# Patient Record
Sex: Female | Born: 1965 | Race: White | Hispanic: No | Marital: Married | Smoking: Former smoker
Health system: Southern US, Community
[De-identification: ages and names within clinical notes are randomized; demographics above are authoritative.]

## PROBLEM LIST (undated history)

## (undated) DIAGNOSIS — F32A Depression, unspecified: Secondary | ICD-10-CM

## (undated) DIAGNOSIS — D352 Benign neoplasm of pituitary gland: Secondary | ICD-10-CM

## (undated) DIAGNOSIS — F329 Major depressive disorder, single episode, unspecified: Secondary | ICD-10-CM

## (undated) DIAGNOSIS — E785 Hyperlipidemia, unspecified: Secondary | ICD-10-CM

## (undated) HISTORY — PX: ENDOMETRIAL ABLATION W/ NOVASURE: SUR434

## (undated) HISTORY — DX: Hyperlipidemia, unspecified: E78.5

## (undated) HISTORY — DX: Benign neoplasm of pituitary gland: D35.2

---

## 1999-09-15 ENCOUNTER — Other Ambulatory Visit: Admission: RE | Admit: 1999-09-15 | Discharge: 1999-09-15 | Payer: Self-pay | Admitting: General Practice

## 2000-07-18 ENCOUNTER — Ambulatory Visit (HOSPITAL_COMMUNITY): Admission: RE | Admit: 2000-07-18 | Discharge: 2000-07-18 | Payer: Self-pay | Admitting: Internal Medicine

## 2000-07-18 ENCOUNTER — Encounter: Payer: Self-pay | Admitting: Internal Medicine

## 2001-07-02 ENCOUNTER — Other Ambulatory Visit: Admission: RE | Admit: 2001-07-02 | Discharge: 2001-07-02 | Payer: Self-pay | Admitting: Obstetrics and Gynecology

## 2002-08-30 ENCOUNTER — Other Ambulatory Visit: Admission: RE | Admit: 2002-08-30 | Discharge: 2002-08-30 | Payer: Self-pay | Admitting: Obstetrics and Gynecology

## 2002-09-03 ENCOUNTER — Encounter: Admission: RE | Admit: 2002-09-03 | Discharge: 2002-09-03 | Payer: Self-pay | Admitting: Obstetrics and Gynecology

## 2002-09-03 ENCOUNTER — Encounter: Payer: Self-pay | Admitting: Obstetrics and Gynecology

## 2002-11-18 ENCOUNTER — Encounter: Payer: Self-pay | Admitting: Obstetrics and Gynecology

## 2002-11-18 ENCOUNTER — Encounter: Admission: RE | Admit: 2002-11-18 | Discharge: 2002-11-18 | Payer: Self-pay | Admitting: Obstetrics and Gynecology

## 2005-07-30 ENCOUNTER — Encounter: Admission: RE | Admit: 2005-07-30 | Discharge: 2005-07-30 | Payer: Self-pay | Admitting: Obstetrics & Gynecology

## 2005-10-08 ENCOUNTER — Encounter: Admission: RE | Admit: 2005-10-08 | Discharge: 2005-10-08 | Payer: Self-pay | Admitting: Adult Health

## 2005-12-30 ENCOUNTER — Emergency Department (HOSPITAL_COMMUNITY): Admission: EM | Admit: 2005-12-30 | Discharge: 2005-12-30 | Payer: Self-pay | Admitting: Emergency Medicine

## 2006-05-03 ENCOUNTER — Encounter: Admission: RE | Admit: 2006-05-03 | Discharge: 2006-05-03 | Payer: Self-pay | Admitting: Obstetrics & Gynecology

## 2007-10-14 ENCOUNTER — Ambulatory Visit (HOSPITAL_COMMUNITY): Admission: RE | Admit: 2007-10-14 | Discharge: 2007-10-14 | Payer: Self-pay | Admitting: Obstetrics & Gynecology

## 2007-12-08 ENCOUNTER — Encounter: Admission: RE | Admit: 2007-12-08 | Discharge: 2007-12-08 | Payer: Self-pay | Admitting: Obstetrics & Gynecology

## 2009-01-03 ENCOUNTER — Ambulatory Visit (HOSPITAL_COMMUNITY): Admission: RE | Admit: 2009-01-03 | Discharge: 2009-01-03 | Payer: Self-pay | Admitting: Internal Medicine

## 2009-08-17 ENCOUNTER — Encounter: Admission: RE | Admit: 2009-08-17 | Discharge: 2009-08-17 | Payer: Self-pay | Admitting: Internal Medicine

## 2009-09-05 ENCOUNTER — Emergency Department (HOSPITAL_COMMUNITY): Admission: EM | Admit: 2009-09-05 | Discharge: 2009-09-06 | Payer: Self-pay | Admitting: Emergency Medicine

## 2010-04-17 ENCOUNTER — Encounter: Admission: RE | Admit: 2010-04-17 | Discharge: 2010-04-17 | Payer: Self-pay | Admitting: Internal Medicine

## 2010-11-25 ENCOUNTER — Encounter: Payer: Self-pay | Admitting: Internal Medicine

## 2011-02-02 ENCOUNTER — Emergency Department (HOSPITAL_COMMUNITY): Payer: Managed Care, Other (non HMO)

## 2011-02-02 ENCOUNTER — Emergency Department (HOSPITAL_COMMUNITY)
Admission: EM | Admit: 2011-02-02 | Discharge: 2011-02-02 | Disposition: A | Discharge status: Home / Self Care | Payer: Managed Care, Other (non HMO) | Attending: Emergency Medicine | Admitting: Emergency Medicine

## 2011-02-02 DIAGNOSIS — R071 Chest pain on breathing: Secondary | ICD-10-CM | POA: Insufficient documentation

## 2011-02-02 DIAGNOSIS — R209 Unspecified disturbances of skin sensation: Secondary | ICD-10-CM | POA: Insufficient documentation

## 2011-02-02 DIAGNOSIS — R197 Diarrhea, unspecified: Secondary | ICD-10-CM | POA: Insufficient documentation

## 2011-02-02 DIAGNOSIS — F17200 Nicotine dependence, unspecified, uncomplicated: Secondary | ICD-10-CM | POA: Insufficient documentation

## 2011-02-02 DIAGNOSIS — R072 Precordial pain: Secondary | ICD-10-CM | POA: Insufficient documentation

## 2011-02-02 LAB — CBC
HCT: 38.1 % (ref 36.0–46.0)
MCH: 34.1 pg — ABNORMAL HIGH (ref 26.0–34.0)
MCHC: 34.6 g/dL (ref 30.0–36.0)
MCV: 98.4 fL (ref 78.0–100.0)
RDW: 13 % (ref 11.5–15.5)

## 2011-02-02 LAB — DIFFERENTIAL
Eosinophils Relative: 4 % (ref 0–5)
Lymphocytes Relative: 34 % (ref 12–46)
Lymphs Abs: 2.3 10*3/uL (ref 0.7–4.0)
Monocytes Absolute: 0.5 10*3/uL (ref 0.1–1.0)
Monocytes Relative: 8 % (ref 3–12)

## 2011-02-02 LAB — BASIC METABOLIC PANEL
CO2: 25 mEq/L (ref 19–32)
GFR calc Af Amer: >60 mL/min (ref >60)
Glucose, Bld: 91 mg/dL (ref 70–99)
Potassium: 3.2 mEq/L — ABNORMAL LOW (ref 3.5–5.1)
Sodium: 138 mEq/L (ref 135–145)

## 2011-02-02 LAB — POCT CARDIAC MARKERS
CKMB, poc: <1 ng/mL — ABNORMAL LOW (ref 1.0–8.0)
Myoglobin, poc: 26.6 ng/mL (ref 12–200)
Troponin i, poc: <0.05 ng/mL (ref 0.00–0.09)

## 2011-03-21 ENCOUNTER — Other Ambulatory Visit (HOSPITAL_COMMUNITY): Payer: Self-pay | Admitting: Internal Medicine

## 2011-03-21 DIAGNOSIS — Z1231 Encounter for screening mammogram for malignant neoplasm of breast: Secondary | ICD-10-CM

## 2011-04-23 ENCOUNTER — Ambulatory Visit (HOSPITAL_COMMUNITY)
Admission: RE | Admit: 2011-04-23 | Discharge: 2011-04-23 | Disposition: A | Discharge status: Home / Self Care | Payer: Managed Care, Other (non HMO) | Source: Ambulatory Visit | Attending: Internal Medicine | Admitting: Internal Medicine

## 2011-04-23 DIAGNOSIS — Z1231 Encounter for screening mammogram for malignant neoplasm of breast: Secondary | ICD-10-CM | POA: Insufficient documentation

## 2011-11-07 ENCOUNTER — Encounter (HOSPITAL_COMMUNITY): Payer: Self-pay

## 2011-11-07 ENCOUNTER — Encounter (HOSPITAL_COMMUNITY): Payer: Self-pay | Admitting: *Deleted

## 2011-11-11 ENCOUNTER — Other Ambulatory Visit: Payer: Self-pay | Admitting: Obstetrics & Gynecology

## 2011-11-15 ENCOUNTER — Encounter (HOSPITAL_COMMUNITY): Payer: Self-pay | Admitting: Anesthesiology

## 2011-11-15 ENCOUNTER — Ambulatory Visit (HOSPITAL_COMMUNITY): Payer: Managed Care, Other (non HMO) | Admitting: Anesthesiology

## 2011-11-15 ENCOUNTER — Other Ambulatory Visit: Payer: Self-pay | Admitting: Obstetrics & Gynecology

## 2011-11-15 ENCOUNTER — Encounter (HOSPITAL_COMMUNITY)
Admission: RE | Disposition: A | Discharge status: Home / Self Care | Payer: Self-pay | Source: Ambulatory Visit | Attending: Obstetrics & Gynecology

## 2011-11-15 ENCOUNTER — Ambulatory Visit (HOSPITAL_COMMUNITY)
Admission: RE | Admit: 2011-11-15 | Discharge: 2011-11-15 | Disposition: A | Discharge status: Home / Self Care | Payer: Managed Care, Other (non HMO) | Source: Ambulatory Visit | Attending: Obstetrics & Gynecology | Admitting: Obstetrics & Gynecology

## 2011-11-15 ENCOUNTER — Encounter (HOSPITAL_COMMUNITY): Payer: Self-pay | Admitting: *Deleted

## 2011-11-15 DIAGNOSIS — D250 Submucous leiomyoma of uterus: Secondary | ICD-10-CM | POA: Insufficient documentation

## 2011-11-15 DIAGNOSIS — N920 Excessive and frequent menstruation with regular cycle: Secondary | ICD-10-CM | POA: Insufficient documentation

## 2011-11-15 HISTORY — DX: Depression, unspecified: F32.A

## 2011-11-15 HISTORY — DX: Major depressive disorder, single episode, unspecified: F32.9

## 2011-11-15 LAB — CBC
Hemoglobin: 10.2 g/dL — ABNORMAL LOW (ref 12.0–15.0)
MCHC: 33.3 g/dL (ref 30.0–36.0)
Platelets: 280 10*3/uL (ref 150–400)
RBC: 3.04 MIL/uL — ABNORMAL LOW (ref 3.87–5.11)

## 2011-11-15 LAB — PREGNANCY, URINE: Preg Test, Ur: NEGATIVE

## 2011-11-15 SURGERY — DILATATION & CURETTAGE/HYSTEROSCOPY WITH RESECTOCOPE
Anesthesia: General | Site: Vagina | Laterality: Right | Wound class: Clean Contaminated

## 2011-11-15 MED ORDER — FENTANYL CITRATE 0.05 MG/ML IJ SOLN: 25.0000 ug | INTRAMUSCULAR | Status: DC | PRN

## 2011-11-15 MED ORDER — FENTANYL CITRATE 0.05 MG/ML IJ SOLN
INTRAMUSCULAR | Status: AC
  Filled 2011-11-15: qty 2

## 2011-11-15 MED ORDER — DEXAMETHASONE SODIUM PHOSPHATE 10 MG/ML IJ SOLN
INTRAMUSCULAR | Status: AC
  Filled 2011-11-15: qty 1

## 2011-11-15 MED ORDER — MEPERIDINE HCL 25 MG/ML IJ SOLN: 6.2500 mg | INTRAMUSCULAR | Status: DC | PRN

## 2011-11-15 MED ORDER — SODIUM CHLORIDE 0.9 % IR SOLN
Status: DC | PRN
  Administered 2011-11-15 (×3): 3000 mL

## 2011-11-15 MED ORDER — EPHEDRINE SULFATE 50 MG/ML IJ SOLN
INTRAMUSCULAR | Status: DC | PRN
  Administered 2011-11-15: 10 mg via INTRAVENOUS

## 2011-11-15 MED ORDER — GLYCOPYRROLATE 0.2 MG/ML IJ SOLN
INTRAMUSCULAR | Status: DC | PRN
  Administered 2011-11-15: .4 mg via INTRAVENOUS

## 2011-11-15 MED ORDER — LACTATED RINGERS IV SOLN
INTRAVENOUS | Status: DC
  Administered 2011-11-15: 125 mL/h via INTRAVENOUS

## 2011-11-15 MED ORDER — OXYCODONE-ACETAMINOPHEN 7.5-325 MG PO TABS: 1.0000 | ORAL_TABLET | ORAL | Status: AC | PRN

## 2011-11-15 MED ORDER — PROPOFOL 10 MG/ML IV EMUL
INTRAVENOUS | Status: DC | PRN
  Administered 2011-11-15: 200 mg via INTRAVENOUS
  Administered 2011-11-15: 50 mg via INTRAVENOUS

## 2011-11-15 MED ORDER — ATROPINE SULFATE 0.1 MG/ML IJ SOLN
INTRAMUSCULAR | Status: AC
  Filled 2011-11-15: qty 10

## 2011-11-15 MED ORDER — KETOROLAC TROMETHAMINE 30 MG/ML IJ SOLN
INTRAMUSCULAR | Status: AC
  Filled 2011-11-15: qty 1

## 2011-11-15 MED ORDER — KETOROLAC TROMETHAMINE 30 MG/ML IJ SOLN: 15.0000 mg | Freq: Once | INTRAMUSCULAR | Status: DC | PRN

## 2011-11-15 MED ORDER — LIDOCAINE HCL (CARDIAC) 20 MG/ML IV SOLN
INTRAVENOUS | Status: DC | PRN
  Administered 2011-11-15: 60 mg via INTRAVENOUS

## 2011-11-15 MED ORDER — OXYCODONE-ACETAMINOPHEN 5-325 MG PO TABS
ORAL_TABLET | ORAL | Status: AC
  Filled 2011-11-15: qty 1

## 2011-11-15 MED ORDER — FENTANYL CITRATE 0.05 MG/ML IJ SOLN
INTRAMUSCULAR | Status: DC | PRN
  Administered 2011-11-15 (×2): 50 ug via INTRAVENOUS
  Administered 2011-11-15: 25 ug via INTRAVENOUS
  Administered 2011-11-15: 50 ug via INTRAVENOUS
  Administered 2011-11-15 (×2): 12.5 ug via INTRAVENOUS

## 2011-11-15 MED ORDER — ONDANSETRON HCL 4 MG/2ML IJ SOLN: 4.0000 mg | Freq: Once | INTRAMUSCULAR | Status: DC | PRN

## 2011-11-15 MED ORDER — MIDAZOLAM HCL 2 MG/2ML IJ SOLN
INTRAMUSCULAR | Status: AC
  Filled 2011-11-15: qty 2

## 2011-11-15 MED ORDER — ONDANSETRON HCL 4 MG/2ML IJ SOLN
INTRAMUSCULAR | Status: DC | PRN
  Administered 2011-11-15: 4 mg via INTRAVENOUS

## 2011-11-15 MED ORDER — CEFAZOLIN SODIUM 1-5 GM-% IV SOLN
INTRAVENOUS | Status: AC
  Filled 2011-11-15: qty 50

## 2011-11-15 MED ORDER — OXYCODONE-ACETAMINOPHEN 5-325 MG PO TABS
1.0000 | ORAL_TABLET | ORAL | Status: DC | PRN
  Administered 2011-11-15: 1 via ORAL

## 2011-11-15 MED ORDER — CEFAZOLIN SODIUM 1-5 GM-% IV SOLN
1.0000 g | INTRAVENOUS | Status: AC
  Administered 2011-11-15: 1 g via INTRAVENOUS

## 2011-11-15 MED ORDER — GLYCOPYRROLATE 0.2 MG/ML IJ SOLN
INTRAMUSCULAR | Status: AC
  Filled 2011-11-15: qty 1

## 2011-11-15 MED ORDER — KETOROLAC TROMETHAMINE 30 MG/ML IJ SOLN
INTRAMUSCULAR | Status: DC | PRN
  Administered 2011-11-15: 30 mg via INTRAVENOUS

## 2011-11-15 MED ORDER — CHLOROPROCAINE HCL 1 % IJ SOLN
INTRAMUSCULAR | Status: DC | PRN
  Administered 2011-11-15: 20 mL

## 2011-11-15 MED ORDER — ATROPINE SULFATE 0.4 MG/ML IJ SOLN
INTRAMUSCULAR | Status: DC | PRN
  Administered 2011-11-15: 0.4 mg via INTRAVENOUS

## 2011-11-15 MED ORDER — DEXAMETHASONE SODIUM PHOSPHATE 10 MG/ML IJ SOLN
INTRAMUSCULAR | Status: DC | PRN
  Administered 2011-11-15: 10 mg via INTRAVENOUS

## 2011-11-15 MED ORDER — ONDANSETRON HCL 4 MG/2ML IJ SOLN
INTRAMUSCULAR | Status: AC
  Filled 2011-11-15: qty 2

## 2011-11-15 MED ORDER — MIDAZOLAM HCL 5 MG/5ML IJ SOLN
INTRAMUSCULAR | Status: DC | PRN
  Administered 2011-11-15 (×2): 1 mg via INTRAVENOUS

## 2011-11-15 SURGICAL SUPPLY — 14 items
ABLATOR ENDOMETRIAL BIPOLAR (ABLATOR) ×3 IMPLANT
CANISTER SUCTION 2500CC (MISCELLANEOUS) ×6 IMPLANT
CATH ROBINSON RED A/P 16FR (CATHETERS) ×3 IMPLANT
CLOTH BEACON ORANGE TIMEOUT ST (SAFETY) ×3 IMPLANT
CONTAINER PREFILL 10% NBF 60ML (FORM) ×5 IMPLANT
ELECT REM PT RETURN 9FT ADLT (ELECTROSURGICAL) ×3
ELECTRODE REM PT RTRN 9FT ADLT (ELECTROSURGICAL) ×2 IMPLANT
GLOVE BIO SURGEON STRL SZ 6.5 (GLOVE) ×6 IMPLANT
GOWN PREVENTION PLUS LG XLONG (DISPOSABLE) ×6 IMPLANT
GOWN STRL REIN XL XLG (GOWN DISPOSABLE) ×3 IMPLANT
LOOP ANGLED CUTTING 22FR (CUTTING LOOP) IMPLANT
PACK HYSTEROSCOPY LF (CUSTOM PROCEDURE TRAY) ×3 IMPLANT
TOWEL OR 17X24 6PK STRL BLUE (TOWEL DISPOSABLE) ×6 IMPLANT
WATER STERILE IRR 1000ML POUR (IV SOLUTION) ×2 IMPLANT

## 2011-11-15 NOTE — Anesthesia Postprocedure Evaluation (Signed)
Anesthesia Post Note  Patient: Tami Russell  Procedure(s) Performed:  DILATATION & CURETTAGE/HYSTEROSCOPY WITH RESECTOCOPE - with versapoint; DILATATION & CURETTAGE/HYSTEROSCOPY WITH NOVASURE ABLATION  Anesthesia type: General  Patient location: PACU  Post pain: Pain level controlled  Post assessment: Post-op Vital signs reviewed  Last Vitals:  Filed Vitals:   11/15/11 1645  BP: 124/77  Pulse: 83  Temp:   Resp: 18    Post vital signs: Reviewed  Level of consciousness: sedated  Complications: No apparent anesthesia complicationsfj

## 2011-11-15 NOTE — Discharge Summary (Signed)
  Physician Discharge Summary  Patient ID: Tami Russell MRN: [id number] DOB/AGE: 45-Apr-1967 45 y.o.  Admit date: 11/15/2011 Discharge date: 11/15/2011  Admission Diagnoses: Menorrhagia  Discharge Diagnoses: Menorrhagia        Active Problems:  * No active hospital problems. *    Discharged Condition: good  Hospital Course:  Outpatient  Consults: none  Treatments: surgery: Hysteroscopy, Versapoint resection of myoma, D+C, Novasure endometrial ablation.  Disposition: Home or Self Care   Current Discharge Medication List    START taking these medications   Details  oxyCODONE-acetaminophen (PERCOCET) 7.5-325 MG per tablet Take 1 tablet by mouth every 4 (four) hours as needed for pain. Qty: 30 tablet, Refills: 0      CONTINUE these medications which have NOT CHANGED   Details  FLUoxetine (PROZAC) 20 MG capsule Take 20 mg by mouth daily.      calcium citrate-vitamin D 200-200 MG-UNIT TABS Take 2 tablets by mouth daily.      ibuprofen (ADVIL,MOTRIN) 200 MG tablet Take 200 mg by mouth as needed. Pain          Follow-up Information    Follow up with Kassia Demarinis,MARIE-LYNE, MD in 3 weeks.   Contact information:   [address] [place] [address] [phone]          SignedGenia Del, MD 11/15/2011, 4:50 PM

## 2011-11-15 NOTE — H&P (Signed)
Chandrika D Mongiello is an 46 y.o. female G0  RP:  Menometrorrhagia with submucosal myoma for HSC, D+C, Versapoint resection, Novasure endometrial ablation  HPI:  Persistent menometro, with failure to Mirena IUD.  Removed.  Bleeding most days.    Pertinent Gynecological History: Menses: flow is excessive with use of many pads or tampons on heaviest days Bleeding: intermenstrual bleeding Contraception: condoms, planning vasectomy Blood transfusions: none Sexually transmitted diseases: no past history Previous GYN Procedures: None  Last mammogram: normal  Last pap: normal  OB History: G0   Menstrual History:  No LMP recorded.    Past Medical History  Diagnosis Date  . Depression     takes prozac    History reviewed. No pertinent past surgical history.  History reviewed. No pertinent family history.  Social History:  reports that she quit smoking about 10 months ago. She does not have any smokeless tobacco history on file. She reports that she drinks alcohol. She reports that she does not use illicit drugs.  Allergies:  Allergies  Allergen Reactions  . Sulfa Antibiotics Rash    Facial rash    No prescriptions prior to admission    NAD Temp 98.6   BP 148/88  Pulse 73  RR normal Pelvic US Submucosal myoma, 2.8 cm.  Otherwise wnl.  No results found for this or any previous visit (from the past 24 hour(s)).  No results found.  Assessment/Plan: Menometro with submucosal myoma 2.8 cm for HSC, resection, D+C, Novasure endometrial ablation.  Surgery and risks reviewed.  Trea Carnegie,MARIE-LYNE 11/15/2011, 1:49 PM

## 2011-11-15 NOTE — Transfer of Care (Signed)
Immediate Anesthesia Transfer of Care Note  Patient: Tami Russell  Procedure(s) Performed:  DILATATION & CURETTAGE/HYSTEROSCOPY WITH RESECTOCOPE - with versapoint; DILATATION & CURETTAGE/HYSTEROSCOPY WITH NOVASURE ABLATION  Patient Location: PACU  Anesthesia Type: General  Level of Consciousness: awake, alert  and oriented  Airway & Oxygen Therapy: Patient Spontanous Breathing  Post-op Assessment: Report given to PACU RN and Post -op Vital signs reviewed and stable  Post vital signs: Reviewed and stable Filed Vitals:   11/15/11 1404  BP: 148/88  Pulse: 73  Temp: 37 C  Resp: 16    Complications: No apparent anesthesia complications

## 2011-11-15 NOTE — Op Note (Signed)
11/15/2011  4:25 PM  PATIENT:  Tami Russell  46 y.o. female  PRE-OPERATIVE DIAGNOSIS:  Menometrorrhagia with submucosal myoma  POST-OPERATIVE DIAGNOSIS:  same  PROCEDURE:  Procedure(s): HYSTEROSCOPY WITH RESECTION OF SUBMUCOSAL MYOMA (VERSAPOINT), DILATATION AND CURETTAGE AND NOVASURE ENDOMETRIAL ABLATION  SURGEON:  Surgeon(s): Genia Del, MD  ASSISTANTS: none   ANESTHESIA:   general  PROCEDURE:  Under general anesthesia with endotracheal intubation the patient is in lithotomy position. She is prepped with surgiprep and Betadine.  The patient is draped as usual. A timeout is done. The vaginal exam reveals an anteverted uterus about 9 cm diameter, mobile, no adnexal mass.  The speculum was introduced inside the vagina. The anterior lip of the cervix was grasped with a tenaculum. A paracervical block was done with Nesacaine 1% 20 cc total at 4 and 8:00. The cervical measurement was at 4 cm and the hysterometry at 9 cm, for a uterine cavity length of 5 cm.  Dilatation of the cervix was done with Hegar dilators up to #27 without difficulty.  The operative hysteroscope was introduced and the intrauterine cavity. A large submucosal anterior myoma was visualized. The myoma was completely resected with the VersaPoint loop.  Hemostasis was completed with coag using VersaPoint at the myoma bed. The myoma diameter was estimated a little larger than 3 cm. All pieces were sent to pathology.  An endometrial curettage was done with a sharp curet on all intrauterine surfaces. That specimen was sent to pathology with the myoma. We then proceeded with the NovaSure endometrial ablation. The uterine cavity integrity test was passed.The width of the uterus was measured at 3.9 cm.  The NovaSure endometrial ablation lasted 81 seconds. The instrument was then removed. The hysteroscope was reintroduced and the intrauterine cavity and good cauterization was confirmed. Pictures were taken before myomectomy and  after myomectomy as well as after endometrial ablation. All instruments were then removed. Silver nitrate was used to on complete hemostasis where the tenaculum was on the cervix. The patient was brought to recovery room in good stable status.  ESTIMATED BLOOD LOSS: 75 cc  Fluid deficit:  900 cc (saline)   Intake/Output Summary (Last 24 hours) at 11/15/11 1625 Last data filed at 11/15/11 1621  Gross per 24 hour  Intake   1000 ml  Output     75 ml  Net    925 ml     BLOOD ADMINISTERED:none   LOCAL MEDICATIONS USED:  NESACAINE 1% 20 CC PARACERVICAL BLOCK  SPECIMEN:  Source of Specimen:  SUBMUCOSAL MYOMA AND ENDOMETRIAL CURETTINGS  DISPOSITION OF SPECIMEN:  PATHOLOGY  COUNTS:  YES   PLAN OF CARE: Transfer to PACU   Genia Del MD  11/15/2011 at 16:29

## 2011-11-15 NOTE — Anesthesia Preprocedure Evaluation (Signed)
Anesthesia Evaluation  Patient identified by MRN, date of birth, ID band  Reviewed: Allergy & Precautions, H&P , NPO status , Patient's Chart, lab work & pertinent test results  Airway Mallampati: I TM Distance: >3 FB Neck ROM: full    Dental No notable dental hx. (+) Teeth Intact   Pulmonary neg pulmonary ROS,    Pulmonary exam normal       Cardiovascular neg cardio ROS     Neuro/Psych PSYCHIATRIC DISORDERS Depression Negative Neurological ROS     GI/Hepatic negative GI ROS, Neg liver ROS,   Endo/Other  Negative Endocrine ROS  Renal/GU negative Renal ROS  Genitourinary negative   Musculoskeletal   Abdominal Normal abdominal exam  (+)   Peds negative pediatric ROS (+)  Hematology negative hematology ROS (+)   Anesthesia Other Findings   Reproductive/Obstetrics negative OB ROS                           Anesthesia Physical Anesthesia Plan  ASA: II  Anesthesia Plan: General   Post-op Pain Management:    Induction: Intravenous  Airway Management Planned: LMA  Additional Equipment:   Intra-op Plan:   Post-operative Plan:   Informed Consent: I have reviewed the patients History and Physical, chart, labs and discussed the procedure including the risks, benefits and alternatives for the proposed anesthesia with the patient or authorized representative who has indicated his/her understanding and acceptance.     Plan Discussed with: CRNA  Anesthesia Plan Comments:         Anesthesia Quick Evaluation

## 2011-11-15 NOTE — Anesthesia Procedure Notes (Signed)
Procedure Name: LMA Insertion Date/Time: 11/15/2011 3:20 PM Performed by: Claudene Gatliff MARIE Pre-anesthesia Checklist: Patient identified, Emergency Drugs available, Suction available, Patient being monitored and Timeout performed Patient Re-evaluated:Patient Re-evaluated prior to inductionOxygen Delivery Method: Circle System Utilized Preoxygenation: Pre-oxygenation with 100% oxygen Intubation Type: IV induction LMA: LMA inserted LMA Size: 4.0 Placement Confirmation: positive ETCO2 and breath sounds checked- equal and bilateral Dental Injury: Teeth and Oropharynx as per pre-operative assessment

## 2012-04-27 ENCOUNTER — Other Ambulatory Visit (HOSPITAL_COMMUNITY): Payer: Self-pay | Admitting: Obstetrics & Gynecology

## 2012-04-27 DIAGNOSIS — Z1239 Encounter for other screening for malignant neoplasm of breast: Secondary | ICD-10-CM

## 2012-04-27 DIAGNOSIS — Z1231 Encounter for screening mammogram for malignant neoplasm of breast: Secondary | ICD-10-CM

## 2012-05-20 ENCOUNTER — Ambulatory Visit (HOSPITAL_COMMUNITY)
Admission: RE | Admit: 2012-05-20 | Discharge: 2012-05-20 | Disposition: A | Discharge status: Home / Self Care | Payer: Managed Care, Other (non HMO) | Source: Ambulatory Visit | Attending: Obstetrics & Gynecology | Admitting: Obstetrics & Gynecology

## 2012-05-20 DIAGNOSIS — Z1231 Encounter for screening mammogram for malignant neoplasm of breast: Secondary | ICD-10-CM

## 2013-01-29 ENCOUNTER — Other Ambulatory Visit: Payer: Self-pay | Admitting: Dermatology

## 2013-04-20 ENCOUNTER — Other Ambulatory Visit (HOSPITAL_COMMUNITY): Payer: Self-pay | Admitting: Internal Medicine

## 2013-04-20 DIAGNOSIS — Z1231 Encounter for screening mammogram for malignant neoplasm of breast: Secondary | ICD-10-CM

## 2013-05-25 ENCOUNTER — Ambulatory Visit (HOSPITAL_COMMUNITY)
Admission: RE | Admit: 2013-05-25 | Discharge: 2013-05-25 | Disposition: A | Discharge status: Home / Self Care | Payer: Managed Care, Other (non HMO) | Source: Ambulatory Visit | Attending: Internal Medicine | Admitting: Internal Medicine

## 2013-05-25 DIAGNOSIS — Z1231 Encounter for screening mammogram for malignant neoplasm of breast: Secondary | ICD-10-CM

## 2014-02-07 ENCOUNTER — Other Ambulatory Visit: Payer: Self-pay | Admitting: Internal Medicine

## 2014-02-07 ENCOUNTER — Ambulatory Visit
Admission: RE | Admit: 2014-02-07 | Discharge: 2014-02-07 | Disposition: A | Discharge status: Home / Self Care | Payer: Managed Care, Other (non HMO) | Source: Ambulatory Visit | Attending: Internal Medicine | Admitting: Internal Medicine

## 2014-02-07 DIAGNOSIS — T1490XA Injury, unspecified, initial encounter: Secondary | ICD-10-CM

## 2014-04-19 ENCOUNTER — Other Ambulatory Visit (HOSPITAL_COMMUNITY): Payer: Self-pay | Admitting: Internal Medicine

## 2014-04-19 DIAGNOSIS — Z1231 Encounter for screening mammogram for malignant neoplasm of breast: Secondary | ICD-10-CM

## 2014-08-09 ENCOUNTER — Other Ambulatory Visit (HOSPITAL_COMMUNITY): Payer: Self-pay | Admitting: Internal Medicine

## 2014-08-09 DIAGNOSIS — Z1231 Encounter for screening mammogram for malignant neoplasm of breast: Secondary | ICD-10-CM

## 2014-08-12 ENCOUNTER — Ambulatory Visit (HOSPITAL_COMMUNITY)
Admission: RE | Admit: 2014-08-12 | Discharge: 2014-08-12 | Disposition: A | Discharge status: Home / Self Care | Payer: BC Managed Care – PPO | Source: Ambulatory Visit | Attending: Internal Medicine | Admitting: Internal Medicine

## 2014-08-12 DIAGNOSIS — Z1231 Encounter for screening mammogram for malignant neoplasm of breast: Secondary | ICD-10-CM | POA: Diagnosis not present

## 2016-11-04 HISTORY — PX: KNEE SURGERY: SHX244

## 2017-03-21 ENCOUNTER — Ambulatory Visit
Admission: RE | Admit: 2017-03-21 | Discharge: 2017-03-21 | Disposition: A | Discharge status: Home / Self Care | Payer: 59 | Source: Ambulatory Visit | Attending: Internal Medicine | Admitting: Internal Medicine

## 2017-03-21 ENCOUNTER — Other Ambulatory Visit: Payer: Self-pay | Admitting: Internal Medicine

## 2017-03-21 DIAGNOSIS — T1490XA Injury, unspecified, initial encounter: Secondary | ICD-10-CM

## 2017-05-08 ENCOUNTER — Other Ambulatory Visit: Payer: Self-pay | Admitting: Internal Medicine

## 2017-05-08 DIAGNOSIS — M25562 Pain in left knee: Secondary | ICD-10-CM

## 2017-05-14 ENCOUNTER — Ambulatory Visit
Admission: RE | Admit: 2017-05-14 | Discharge: 2017-05-14 | Disposition: A | Discharge status: Home / Self Care | Payer: 59 | Source: Ambulatory Visit | Attending: Internal Medicine | Admitting: Internal Medicine

## 2017-05-14 DIAGNOSIS — M25562 Pain in left knee: Secondary | ICD-10-CM

## 2017-09-04 IMAGING — CR DG KNEE COMPLETE 4+V*L*
4 series · 4 of 4 positions shown · non-contrast
Comparison: None.

CLINICAL DATA: Pain after hitting patella with tennis racket

EXAM:
LEFT KNEE - COMPLETE 4+ VIEW

[w knee ap left]
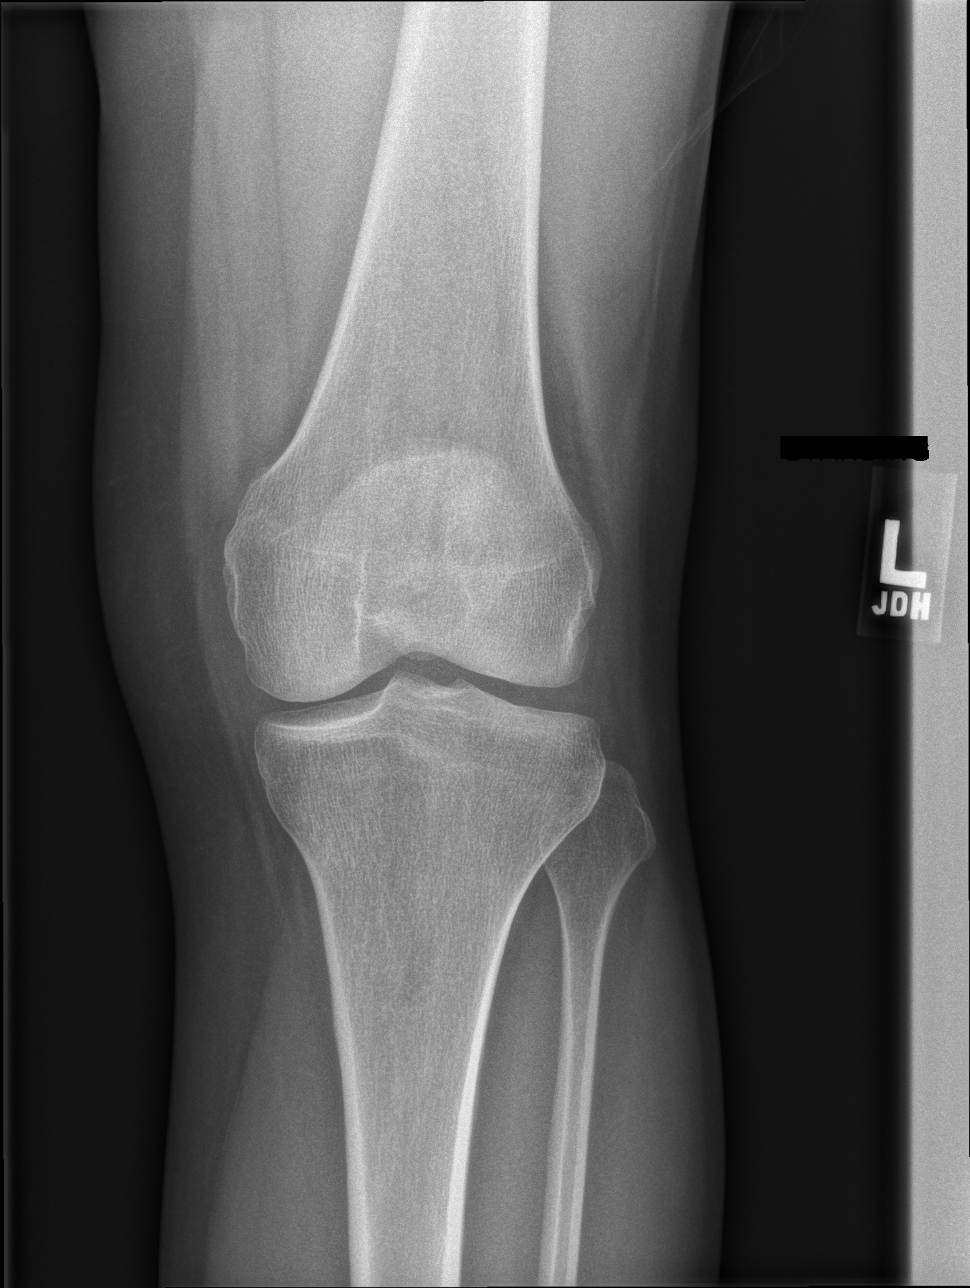

[w knee lat left]
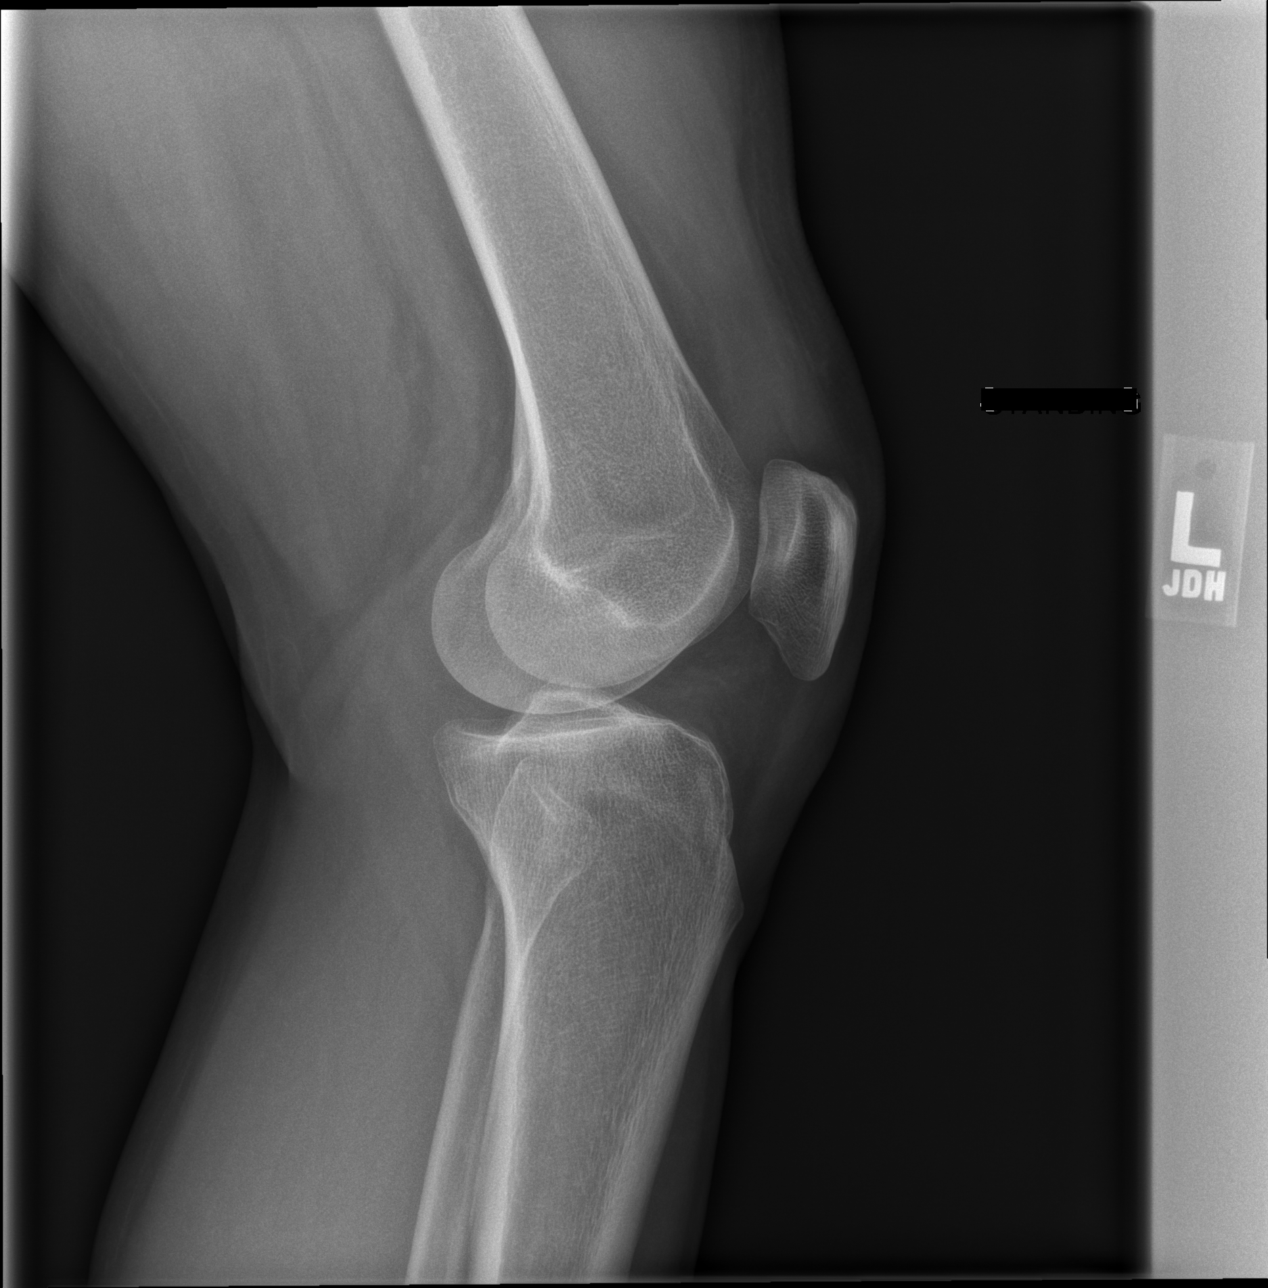

[x knee tunnel left]
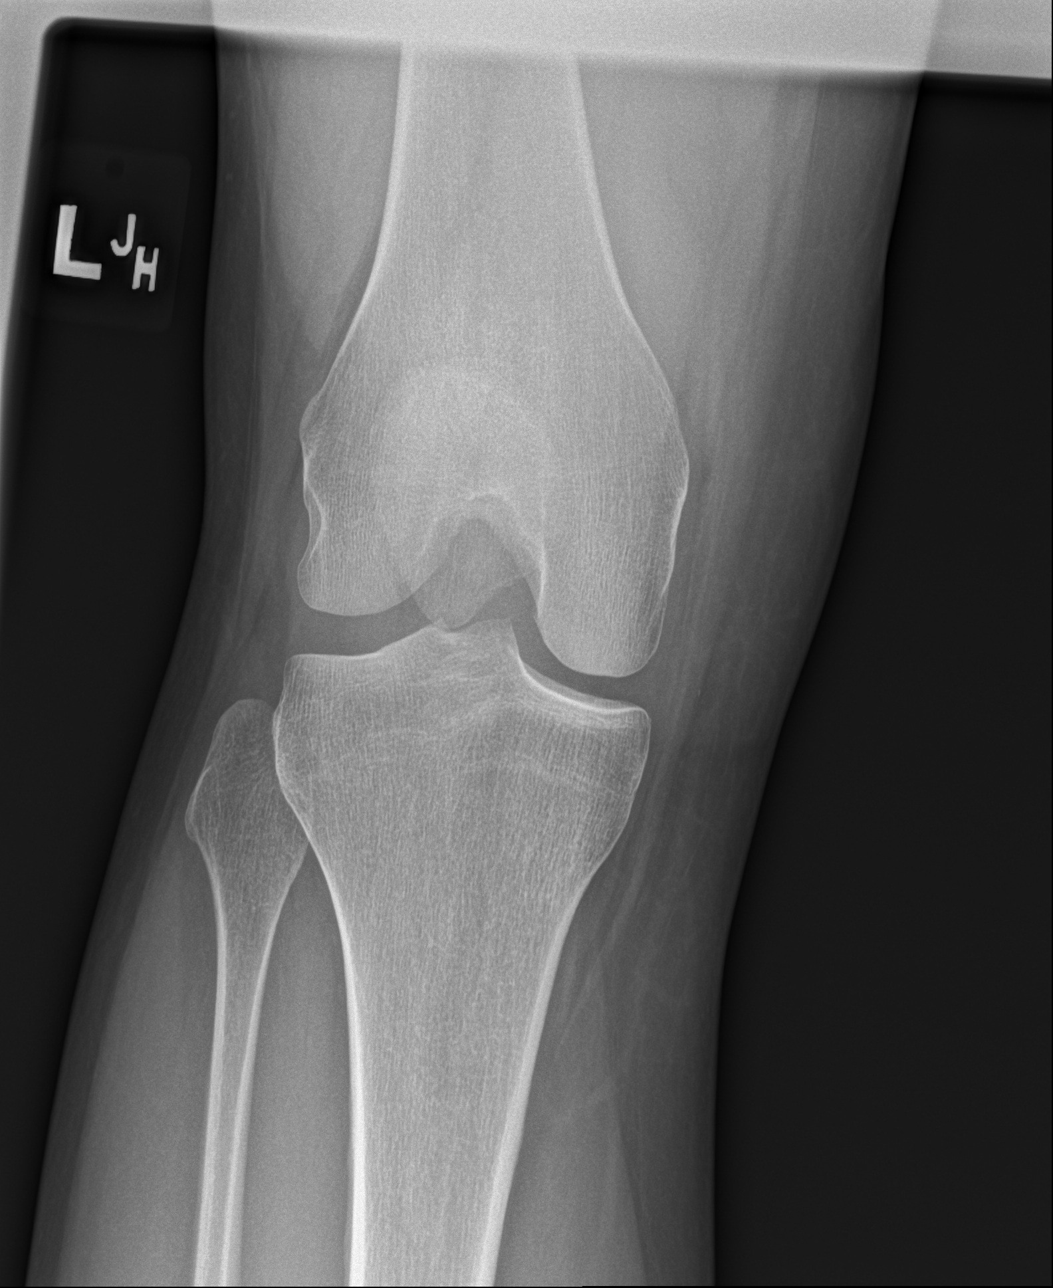

[x knee sunrise left]
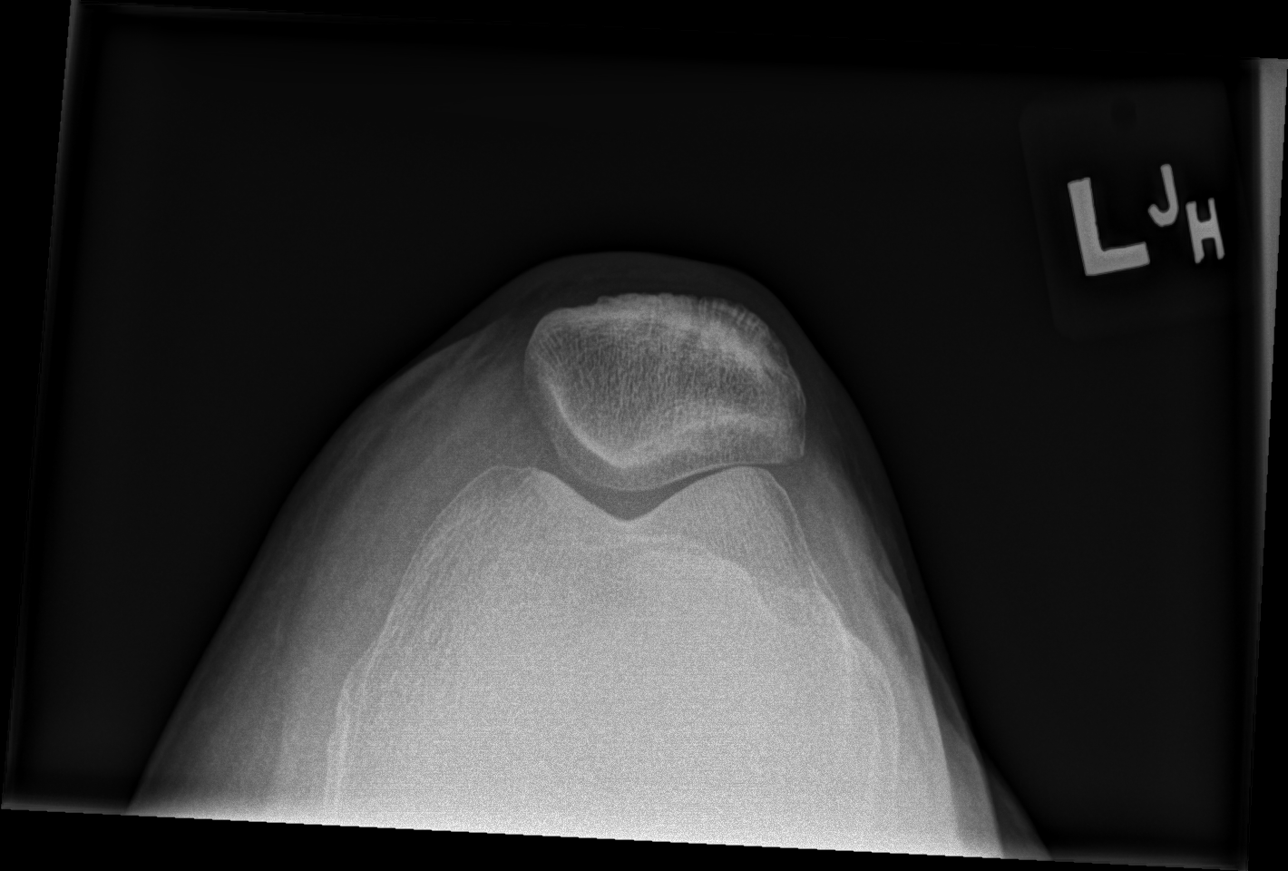

[4 of 4 positions shown; findings below may reference images not displayed]

FINDINGS: Standing frontal, standing lateral, tunnel, and sunrise patellar
images were obtained. There is no fracture or dislocation. There is
a small joint effusion. The joint spaces appear normal. No erosive
change.
IMPRESSION: Small joint effusion. No fracture or dislocation. No appreciable
arthropathy.

## 2019-09-08 LAB — COLOGUARD: Cologuard: NEGATIVE

## 2020-09-05 ENCOUNTER — Telehealth: Payer: Self-pay | Admitting: Internal Medicine

## 2020-09-05 NOTE — Telephone Encounter (Signed)
Provider is going to be out of the office-- ok'd by Apolonio Schneiders to work in to be rescheduled.

## 2020-09-07 ENCOUNTER — Ambulatory Visit (INDEPENDENT_AMBULATORY_CARE_PROVIDER_SITE_OTHER): Payer: 59 | Admitting: Internal Medicine

## 2020-09-07 ENCOUNTER — Other Ambulatory Visit: Payer: Self-pay

## 2020-09-07 ENCOUNTER — Ambulatory Visit: Payer: 59 | Admitting: Internal Medicine

## 2020-09-07 ENCOUNTER — Encounter: Payer: Self-pay | Admitting: Internal Medicine

## 2020-09-07 VITALS — BP 116/76 | HR 68 | Temp 98.1°F | Ht 63.0 in | Wt 157.9 lb

## 2020-09-07 DIAGNOSIS — F32A Depression, unspecified: Secondary | ICD-10-CM | POA: Insufficient documentation

## 2020-09-07 DIAGNOSIS — Z0000 Encounter for general adult medical examination without abnormal findings: Secondary | ICD-10-CM

## 2020-09-07 DIAGNOSIS — F3342 Major depressive disorder, recurrent, in full remission: Secondary | ICD-10-CM | POA: Diagnosis not present

## 2020-09-07 DIAGNOSIS — D352 Benign neoplasm of pituitary gland: Secondary | ICD-10-CM | POA: Diagnosis not present

## 2020-09-07 NOTE — Patient Instructions (Signed)
-Nice seeing you today!!  -Lab work today; will notify you once results are available.  -Remember your flu and shingles vaccines.  -Schedule follow up in 1 year or sooner as needed.   Preventive Care 49-53 Years Old, Female Preventive care refers to visits with your health care provider and lifestyle choices that can promote health and wellness. This includes:  A yearly physical exam. This may also be called an annual well check.  Regular dental visits and eye exams.  Immunizations.  Screening for certain conditions.  Healthy lifestyle choices, such as eating a healthy diet, getting regular exercise, not using drugs or products that contain nicotine and tobacco, and limiting alcohol use. What can I expect for my preventive care visit? Physical exam Your health care provider will check your:  Height and weight. This may be used to calculate body mass index (BMI), which tells if you are at a healthy weight.  Heart rate and blood pressure.  Skin for abnormal spots. Counseling Your health care provider may ask you questions about your:  Alcohol, tobacco, and drug use.  Emotional well-being.  Home and relationship well-being.  Sexual activity.  Eating habits.  Work and work Statistician.  Method of birth control.  Menstrual cycle.  Pregnancy history. What immunizations do I need?  Influenza (flu) vaccine  This is recommended every year. Tetanus, diphtheria, and pertussis (Tdap) vaccine  You may need a Td booster every 10 years. Varicella (chickenpox) vaccine  You may need this if you have not been vaccinated. Zoster (shingles) vaccine  You may need this after age 40. Measles, mumps, and rubella (MMR) vaccine  You may need at least one dose of MMR if you were born in 1957 or later. You may also need a second dose. Pneumococcal conjugate (PCV13) vaccine  You may need this if you have certain conditions and were not previously vaccinated. Pneumococcal  polysaccharide (PPSV23) vaccine  You may need one or two doses if you smoke cigarettes or if you have certain conditions. Meningococcal conjugate (MenACWY) vaccine  You may need this if you have certain conditions. Hepatitis A vaccine  You may need this if you have certain conditions or if you travel or work in places where you may be exposed to hepatitis A. Hepatitis B vaccine  You may need this if you have certain conditions or if you travel or work in places where you may be exposed to hepatitis B. Haemophilus influenzae type b (Hib) vaccine  You may need this if you have certain conditions. Human papillomavirus (HPV) vaccine  If recommended by your health care provider, you may need three doses over 6 months. You may receive vaccines as individual doses or as more than one vaccine together in one shot (combination vaccines). Talk with your health care provider about the risks and benefits of combination vaccines. What tests do I need? Blood tests  Lipid and cholesterol levels. These may be checked every 5 years, or more frequently if you are over 54 years old.  Hepatitis C test.  Hepatitis B test. Screening  Lung cancer screening. You may have this screening every year starting at age 68 if you have a 30-pack-year history of smoking and currently smoke or have quit within the past 15 years.  Colorectal cancer screening. All adults should have this screening starting at age 47 and continuing until age 80. Your health care provider may recommend screening at age 26 if you are at increased risk. You will have tests every 1-10 years,  depending on your results and the type of screening test.  Diabetes screening. This is done by checking your blood sugar (glucose) after you have not eaten for a while (fasting). You may have this done every 1-3 years.  Mammogram. This may be done every 1-2 years. Talk with your health care provider about when you should start having regular  mammograms. This may depend on whether you have a family history of breast cancer.  BRCA-related cancer screening. This may be done if you have a family history of breast, ovarian, tubal, or peritoneal cancers.  Pelvic exam and Pap test. This may be done every 3 years starting at age 13. Starting at age 62, this may be done every 5 years if you have a Pap test in combination with an HPV test. Other tests  Sexually transmitted disease (STD) testing.  Bone density scan. This is done to screen for osteoporosis. You may have this scan if you are at high risk for osteoporosis. Follow these instructions at home: Eating and drinking  Eat a diet that includes fresh fruits and vegetables, whole grains, lean protein, and low-fat dairy.  Take vitamin and mineral supplements as recommended by your health care provider.  Do not drink alcohol if: ? Your health care provider tells you not to drink. ? You are pregnant, may be pregnant, or are planning to become pregnant.  If you drink alcohol: ? Limit how much you have to 0-1 drink a day. ? Be aware of how much alcohol is in your drink. In the [place] one drink equals one 12 oz bottle of beer (355 mL), one 5 oz glass of wine (148 mL), or one 1 oz glass of hard liquor (44 mL). Lifestyle  Take daily care of your teeth and gums.  Stay active. Exercise for at least 30 minutes on 5 or more days each week.  Do not use any products that contain nicotine or tobacco, such as cigarettes, e-cigarettes, and chewing tobacco. If you need help quitting, ask your health care provider.  If you are sexually active, practice safe sex. Use a condom or other form of birth control (contraception) in order to prevent pregnancy and STIs (sexually transmitted infections).  If told by your health care provider, take low-dose aspirin daily starting at age 33. What's next?  Visit your health care provider once a year for a well check visit.  Ask your health care provider  how often you should have your eyes and teeth checked.  Stay up to date on all vaccines. This information is not intended to replace advice given to you by your health care provider. Make sure you discuss any questions you have with your health care provider. Document Revised: 07/02/2018 Document Reviewed: 07/02/2018 Elsevier Patient Education  2020 [employer].

## 2020-09-07 NOTE — Progress Notes (Signed)
New Patient Office Visit     This visit occurred during the SARS-CoV-2 public health emergency.  Safety protocols were in place, including screening questions prior to the visit, additional usage of staff PPE, and extensive cleaning of exam room while observing appropriate contact time as indicated for disinfecting solutions.    CC/Reason for Visit: Establish care, annual preventive exam Previous PCP: Lianne Moris, MD Last Visit: 2019  HPI: Tami Russell is a 54 y.o. female who is coming in today for the above mentioned reasons. 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  She also has a history of depression.  She owns an Retail buyer in town, she has allergies to sulfa drugs which cause hives.  Her medication includes fluoxetine 20 mg daily and hormone replacement therapy.  She follows routinely with GYN.  Her family history significant for mother with MS and a stroke, her father has diabetes and has had a stroke as well.  She had a Cologuard that was negative in 2019, she had a negative mammogram in August of this year, her last Pap smear was earlier this year.  She is due for flu and shingles vaccinations.  She has no acute complaints today.   Past Medical/Surgical History: Past Medical History:  Diagnosis Date  . Depression    takes prozac  . Pituitary microadenoma ([place])     History reviewed. No pertinent surgical history.  Social History:  reports that she quit smoking about 9 years ago. She has never used smokeless tobacco. She reports current alcohol use. She reports that she does not use drugs.  Allergies: Allergies  Allergen Reactions  . Sulfa Antibiotics Rash    Facial rash    Family History:  Family History  Problem Relation Age of Onset  . CVA Mother   . Multiple sclerosis Mother   . CVA Father   . Diabetes Father      Current Outpatient Medications:  .  calcium citrate-vitamin D 200-200 MG-UNIT TABS, Take  2 tablets by mouth daily.  , Disp: , Rfl:  .  DOTTI 0.05 MG/24HR patch, Place onto the skin., Disp: , Rfl:  .  FLUoxetine (PROZAC) 20 MG capsule, Take 20 mg by mouth daily.  , Disp: , Rfl:  .  ibuprofen (ADVIL,MOTRIN) 200 MG tablet, Take 200 mg by mouth as needed. Pain   , Disp: , Rfl:  .  progesterone (PROMETRIUM) 100 MG capsule, , Disp: , Rfl:   Review of Systems:  Constitutional: Denies fever, chills, diaphoresis, appetite change and fatigue.  HEENT: Denies photophobia, eye pain, redness, hearing loss, ear pain, congestion, sore throat, rhinorrhea, sneezing, mouth sores, trouble swallowing, neck pain, neck stiffness and tinnitus.   Respiratory: Denies SOB, DOE, cough, chest tightness,  and wheezing.   Cardiovascular: Denies chest pain, palpitations and leg swelling.  Gastrointestinal: Denies nausea, vomiting, abdominal pain, diarrhea, constipation, blood in stool and abdominal distention.  Genitourinary: Denies dysuria, urgency, frequency, hematuria, flank pain and difficulty urinating.  Endocrine: Denies: hot or cold intolerance, sweats, changes in hair or nails, polyuria, polydipsia. Musculoskeletal: Denies myalgias, back pain, joint swelling, arthralgias and gait problem.  Skin: Denies pallor, rash and wound.  Neurological: Denies dizziness, seizures, syncope, weakness, light-headedness, numbness and headaches.  Hematological: Denies adenopathy. Easy bruising, personal or family bleeding history  Psychiatric/Behavioral: Denies suicidal ideation, mood changes, confusion, nervousness, sleep disturbance and agitation    Physical Exam: Vitals:   09/07/20 1012  BP: 116/76  Pulse: 68  Temp: 98.1 F (36.7 C)  TempSrc: Oral  SpO2: 98%  Weight: 157 lb 14.4 oz (71.6 kg)  Height: 5' 3"  (1.6 m)   Body mass index is 27.97 kg/m.  Constitutional: NAD, calm, comfortable Eyes: PERRL, lids and conjunctivae normal ENMT: Mucous membranes are moist. Posterior pharynx clear of any exudate or  lesions. Normal dentition. Tympanic membrane is pearly white, no erythema or bulging. Neck: normal, supple, no masses, no thyromegaly Respiratory: clear to auscultation bilaterally, no wheezing, no crackles. Normal respiratory effort. No accessory muscle use.  Cardiovascular: Regular rate and rhythm, no murmurs / rubs / gallops. No extremity edema. 2+ pedal pulses. Abdomen: no tenderness, no masses palpated. No hepatosplenomegaly. Bowel sounds positive.  Musculoskeletal: no clubbing / cyanosis. No joint deformity upper and lower extremities. Good ROM, no contractures. Normal muscle tone.  Skin: no rashes, lesions, ulcers. No induration Neurologic: CN 2-12 grossly intact. Sensation intact, DTR normal. Strength 5/5 in all 4.  Psychiatric: Normal judgment and insight. Alert and oriented x 3. Normal mood.    Impression and Plan:  Encounter for preventive health examination -She has routine eye and dental care. -Due for flu and shingles vaccine today, she declines. -Screening labs today. -Healthy lifestyle discussed in detail. -She had a negative Cologuard in 2019, she is willing to pursue colonoscopy when due. -Breast and cervical cancer screening are up-to-date and followed with GYN.  Recurrent major depressive disorder, in full remission ([place])   Office Visit from 09/07/2020 in [place] at [place]  PHQ-9 Total Score 0     -Well-controlled on fluoxetine 20 mg daily.   Pituitary microadenoma [facility]) -Have advised referral to endocrinology, she would like to hold off on this as she does not have medical insurance.  She does not recall when her last MRI was but states "it must have been years".     Patient Instructions  -Nice seeing you today!!  -Lab work today; will notify you once results are available.  -Remember your flu and shingles vaccines.  -Schedule follow up in 1 year or sooner as needed.   Preventive Care 18-31 Years Old, Female Preventive care refers to  visits with your health care provider and lifestyle choices that can promote health and wellness. This includes:  A yearly physical exam. This may also be called an annual well check.  Regular dental visits and eye exams.  Immunizations.  Screening for certain conditions.  Healthy lifestyle choices, such as eating a healthy diet, getting regular exercise, not using drugs or products that contain nicotine and tobacco, and limiting alcohol use. What can I expect for my preventive care visit? Physical exam Your health care provider will check your:  Height and weight. This may be used to calculate body mass index (BMI), which tells if you are at a healthy weight.  Heart rate and blood pressure.  Skin for abnormal spots. Counseling Your health care provider may ask you questions about your:  Alcohol, tobacco, and drug use.  Emotional well-being.  Home and relationship well-being.  Sexual activity.  Eating habits.  Work and work Statistician.  Method of birth control.  Menstrual cycle.  Pregnancy history. What immunizations do I need?  Influenza (flu) vaccine  This is recommended every year. Tetanus, diphtheria, and pertussis (Tdap) vaccine  You may need a Td booster every 10 years. Varicella (chickenpox) vaccine  You may need this if you have not been vaccinated. Zoster (shingles) vaccine  You may need this after age 43. Measles, mumps, and rubella (MMR)  vaccine  You may need at least one dose of MMR if you were born in 1957 or later. You may also need a second dose. Pneumococcal conjugate (PCV13) vaccine  You may need this if you have certain conditions and were not previously vaccinated. Pneumococcal polysaccharide (PPSV23) vaccine  You may need one or two doses if you smoke cigarettes or if you have certain conditions. Meningococcal conjugate (MenACWY) vaccine  You may need this if you have certain conditions. Hepatitis A vaccine  You may need this if  you have certain conditions or if you travel or work in places where you may be exposed to hepatitis A. Hepatitis B vaccine  You may need this if you have certain conditions or if you travel or work in places where you may be exposed to hepatitis B. Haemophilus influenzae type b (Hib) vaccine  You may need this if you have certain conditions. Human papillomavirus (HPV) vaccine  If recommended by your health care provider, you may need three doses over 6 months. You may receive vaccines as individual doses or as more than one vaccine together in one shot (combination vaccines). Talk with your health care provider about the risks and benefits of combination vaccines. What tests do I need? Blood tests  Lipid and cholesterol levels. These may be checked every 5 years, or more frequently if you are over 42 years old.  Hepatitis C test.  Hepatitis B test. Screening  Lung cancer screening. You may have this screening every year starting at age 19 if you have a 30-pack-year history of smoking and currently smoke or have quit within the past 15 years.  Colorectal cancer screening. All adults should have this screening starting at age 3 and continuing until age 46. Your health care provider may recommend screening at age 22 if you are at increased risk. You will have tests every 1-10 years, depending on your results and the type of screening test.  Diabetes screening. This is done by checking your blood sugar (glucose) after you have not eaten for a while (fasting). You may have this done every 1-3 years.  Mammogram. This may be done every 1-2 years. Talk with your health care provider about when you should start having regular mammograms. This may depend on whether you have a family history of breast cancer.  BRCA-related cancer screening. This may be done if you have a family history of breast, ovarian, tubal, or peritoneal cancers.  Pelvic exam and Pap test. This may be done every 3 years  starting at age 65. Starting at age 20, this may be done every 5 years if you have a Pap test in combination with an HPV test. Other tests  Sexually transmitted disease (STD) testing.  Bone density scan. This is done to screen for osteoporosis. You may have this scan if you are at high risk for osteoporosis. Follow these instructions at home: Eating and drinking  Eat a diet that includes fresh fruits and vegetables, whole grains, lean protein, and low-fat dairy.  Take vitamin and mineral supplements as recommended by your health care provider.  Do not drink alcohol if: ? Your health care provider tells you not to drink. ? You are pregnant, may be pregnant, or are planning to become pregnant.  If you drink alcohol: ? Limit how much you have to 0-1 drink a day. ? Be aware of how much alcohol is in your drink. In the [place] one drink equals one 12 oz bottle of beer (355 mL),  one 5 oz glass of wine (148 mL), or one 1 oz glass of hard liquor (44 mL). Lifestyle  Take daily care of your teeth and gums.  Stay active. Exercise for at least 30 minutes on 5 or more days each week.  Do not use any products that contain nicotine or tobacco, such as cigarettes, e-cigarettes, and chewing tobacco. If you need help quitting, ask your health care provider.  If you are sexually active, practice safe sex. Use a condom or other form of birth control (contraception) in order to prevent pregnancy and STIs (sexually transmitted infections).  If told by your health care provider, take low-dose aspirin daily starting at age 38. What's next?  Visit your health care provider once a year for a well check visit.  Ask your health care provider how often you should have your eyes and teeth checked.  Stay up to date on all vaccines. This information is not intended to replace advice given to you by your health care provider. Make sure you discuss any questions you have with your health care provider. Document  Revised: 07/02/2018 Document Reviewed: 07/02/2018 Elsevier Patient Education  2020 [place], MD [facility]

## 2020-09-08 ENCOUNTER — Encounter: Payer: Self-pay | Admitting: Internal Medicine

## 2020-09-08 ENCOUNTER — Other Ambulatory Visit: Payer: Self-pay | Admitting: Internal Medicine

## 2020-09-08 DIAGNOSIS — E559 Vitamin D deficiency, unspecified: Secondary | ICD-10-CM | POA: Insufficient documentation

## 2020-09-08 DIAGNOSIS — E785 Hyperlipidemia, unspecified: Secondary | ICD-10-CM | POA: Insufficient documentation

## 2020-09-08 LAB — CBC WITH DIFFERENTIAL/PLATELET
Absolute Monocytes: 343 cells/uL (ref 200–950)
Basophils Absolute: 59 cells/uL (ref 0–200)
Basophils Relative: 1.2 %
Eosinophils Absolute: 103 cells/uL (ref 15–500)
Eosinophils Relative: 2.1 %
HCT: 39.4 % (ref 35.0–45.0)
Hemoglobin: 13.4 g/dL (ref 11.7–15.5)
Lymphs Abs: 1882 cells/uL (ref 850–3900)
MCH: 32.2 pg (ref 27.0–33.0)
MCHC: 34 g/dL (ref 32.0–36.0)
MCV: 94.7 fL (ref 80.0–100.0)
MPV: 10.1 fL (ref 7.5–12.5)
Monocytes Relative: 7 %
Neutro Abs: 2514 cells/uL (ref 1500–7800)
Neutrophils Relative %: 51.3 %
Platelets: 267 10*3/uL (ref 140–400)
RBC: 4.16 10*6/uL (ref 3.80–5.10)
RDW: 12.4 % (ref 11.0–15.0)
Total Lymphocyte: 38.4 %
WBC: 4.9 10*3/uL (ref 3.8–10.8)

## 2020-09-08 LAB — COMPREHENSIVE METABOLIC PANEL
AG Ratio: 1.9 (calc) (ref 1.0–2.5)
ALT: 21 U/L (ref 6–29)
AST: 16 U/L (ref 10–35)
Albumin: 4.7 g/dL (ref 3.6–5.1)
Alkaline phosphatase (APISO): 91 U/L (ref 37–153)
BUN: 15 mg/dL (ref 7–25)
CO2: 25 mmol/L (ref 20–32)
Calcium: 10 mg/dL (ref 8.6–10.4)
Chloride: 105 mmol/L (ref 98–110)
Creat: 0.7 mg/dL (ref 0.50–1.05)
Globulin: 2.5 g/dL (calc) (ref 1.9–3.7)
Glucose, Bld: 92 mg/dL (ref 65–99)
Potassium: 4.2 mmol/L (ref 3.5–5.3)
Sodium: 140 mmol/L (ref 135–146)
Total Bilirubin: 0.5 mg/dL (ref 0.2–1.2)
Total Protein: 7.2 g/dL (ref 6.1–8.1)

## 2020-09-08 LAB — HEMOGLOBIN A1C
Hgb A1c MFr Bld: 5.4 % of total Hgb (ref <5.7)
Mean Plasma Glucose: 108 (calc)
eAG (mmol/L): 6 (calc)

## 2020-09-08 LAB — VITAMIN D 25 HYDROXY (VIT D DEFICIENCY, FRACTURES): Vit D, 25-Hydroxy: 31 ng/mL (ref 30–100)

## 2020-09-08 LAB — LIPID PANEL
Cholesterol: 262 mg/dL — ABNORMAL HIGH (ref <200)
HDL: 55 mg/dL (ref >=50)
LDL Cholesterol (Calc): 175 mg/dL (calc) — ABNORMAL HIGH
Non-HDL Cholesterol (Calc): 207 mg/dL (calc) — ABNORMAL HIGH (ref <130)
Total CHOL/HDL Ratio: 4.8 (calc) (ref <5.0)
Triglycerides: 167 mg/dL — ABNORMAL HIGH (ref <150)

## 2020-09-08 LAB — TSH: TSH: 1.76 mIU/L

## 2020-09-08 LAB — VITAMIN B12: Vitamin B-12: 308 pg/mL (ref 200–1100)

## 2020-09-08 MED ORDER — ATORVASTATIN CALCIUM 20 MG PO TABS
20.0000 mg | ORAL_TABLET | Freq: Every day | ORAL | 1 refills | Status: DC
Start: 2020-09-08 — End: 2021-02-27

## 2020-09-08 MED ORDER — VITAMIN D (ERGOCALCIFEROL) 1.25 MG (50000 UNIT) PO CAPS: 50000.0000 [IU] | ORAL_CAPSULE | ORAL | 0 refills | Status: DC

## 2020-09-11 ENCOUNTER — Other Ambulatory Visit: Payer: Self-pay | Admitting: Internal Medicine

## 2020-09-11 DIAGNOSIS — E559 Vitamin D deficiency, unspecified: Secondary | ICD-10-CM

## 2020-09-11 DIAGNOSIS — E785 Hyperlipidemia, unspecified: Secondary | ICD-10-CM

## 2020-10-16 ENCOUNTER — Other Ambulatory Visit: Payer: Self-pay | Admitting: Internal Medicine

## 2020-11-27 ENCOUNTER — Other Ambulatory Visit: Payer: Self-pay | Admitting: Internal Medicine

## 2020-11-27 DIAGNOSIS — E559 Vitamin D deficiency, unspecified: Secondary | ICD-10-CM

## 2021-02-26 ENCOUNTER — Other Ambulatory Visit: Payer: Self-pay | Admitting: Internal Medicine

## 2021-02-26 DIAGNOSIS — E785 Hyperlipidemia, unspecified: Secondary | ICD-10-CM

## 2021-03-04 ENCOUNTER — Other Ambulatory Visit: Payer: Self-pay | Admitting: Internal Medicine

## 2021-03-04 DIAGNOSIS — E559 Vitamin D deficiency, unspecified: Secondary | ICD-10-CM

## 2021-04-03 ENCOUNTER — Other Ambulatory Visit: Payer: Self-pay | Admitting: Internal Medicine

## 2021-06-12 ENCOUNTER — Other Ambulatory Visit: Payer: Self-pay | Admitting: Internal Medicine

## 2021-06-12 DIAGNOSIS — E785 Hyperlipidemia, unspecified: Secondary | ICD-10-CM

## 2021-08-30 ENCOUNTER — Other Ambulatory Visit: Payer: Self-pay | Admitting: *Deleted

## 2021-08-30 DIAGNOSIS — E785 Hyperlipidemia, unspecified: Secondary | ICD-10-CM

## 2021-08-30 MED ORDER — FLUOXETINE HCL 20 MG PO CAPS: 20.0000 mg | ORAL_CAPSULE | Freq: Every day | ORAL | 0 refills | Status: DC

## 2021-08-30 MED ORDER — ATORVASTATIN CALCIUM 20 MG PO TABS: ORAL_TABLET | ORAL | 0 refills | Status: DC

## 2021-09-08 LAB — HM MAMMOGRAPHY

## 2021-09-14 ENCOUNTER — Encounter: Payer: Self-pay | Admitting: Internal Medicine

## 2021-11-08 ENCOUNTER — Other Ambulatory Visit: Payer: Self-pay | Admitting: Internal Medicine

## 2021-11-08 DIAGNOSIS — E785 Hyperlipidemia, unspecified: Secondary | ICD-10-CM

## 2021-11-20 ENCOUNTER — Ambulatory Visit (INDEPENDENT_AMBULATORY_CARE_PROVIDER_SITE_OTHER): Payer: Self-pay | Admitting: Family Medicine

## 2021-11-20 VITALS — BP 124/80 | HR 95 | Temp 97.4°F | Wt 159.9 lb

## 2021-11-20 DIAGNOSIS — H9202 Otalgia, left ear: Secondary | ICD-10-CM | POA: Diagnosis not present

## 2021-11-20 DIAGNOSIS — E785 Hyperlipidemia, unspecified: Secondary | ICD-10-CM

## 2021-11-20 LAB — HEPATIC FUNCTION PANEL
ALT: 23 U/L (ref 0–35)
AST: 19 U/L (ref 0–37)
Albumin: 4.8 g/dL (ref 3.5–5.2)
Alkaline Phosphatase: 89 U/L (ref 39–117)
Bilirubin, Direct: 0 mg/dL (ref 0.0–0.3)
Total Bilirubin: 0.4 mg/dL (ref 0.2–1.2)
Total Protein: 7.4 g/dL (ref 6.0–8.3)

## 2021-11-20 LAB — LIPID PANEL
Cholesterol: 164 mg/dL (ref 0–200)
HDL: 48.3 mg/dL (ref >39.00)
LDL Cholesterol: 77 mg/dL (ref 0–99)
NonHDL: 115.83
Total CHOL/HDL Ratio: 3
Triglycerides: 195 mg/dL — ABNORMAL HIGH (ref 0.0–149.0)
VLDL: 39 mg/dL (ref 0.0–40.0)

## 2021-11-20 NOTE — Patient Instructions (Signed)
No signs of ear infection  Consider nasal steroid such as Flonase or Nasacort and continue with daily anti-histamine.

## 2021-11-20 NOTE — Progress Notes (Signed)
Established Patient Office Visit  Subjective:  Patient ID: Tami Russell, female    DOB: 12/13/1965  Age: 56 y.o. MRN: [id number]  CC:  Chief Complaint  Patient presents with   Ear Pain    X 1 week, left ear, runny nose, no other symptoms    HPI Tami Russell presents for 1 week history of acute otalgia left ear.  She has not had any hearing changes.  No drainage.  No sore throat.  She applied some alcohol topically which did not seem to help.  She has had some recent rhinorrhea but no fevers or body aches.  Denies any history of chronic ear problems.  No tinnitus.  No dizziness.  She has had some mild TMJ issues in the past but does not feel this is related.  No pain with chewing.  No visible facial swelling.  She does have history of severe hyperlipidemia.  She had last labs done November 21 and was placed on cholesterol medication at that time and has not had follow-up since then.  She knows she needs to follow-up with primary soon.  She has not had hepatic panel since starting statin medication.  Past Medical History:  Diagnosis Date   Depression    takes prozac   Pituitary microadenoma ([place])     No past surgical history on file.  Family History  Problem Relation Age of Onset   CVA Mother    Multiple sclerosis Mother    CVA Father    Diabetes Father     Social History   Socioeconomic History   Marital status: Married    Spouse name: Not on file   Number of children: Not on file   Years of education: Not on file   Highest education level: Not on file  Occupational History   Not on file  Tobacco Use   Smoking status: Former    Types: Cigarettes    Quit date: 01/05/2011    Years since quitting: 10.8   Smokeless tobacco: Never  Substance and Sexual Activity   Alcohol use: Yes    Comment: occasional   Drug use: No   Sexual activity: Yes    Birth control/protection: None  Other Topics Concern   Not on file  Social History Narrative   Not on file    Social Determinants of Health   Financial Resource Strain: Not on file  Food Insecurity: Not on file  Transportation Needs: Not on file  Physical Activity: Not on file  Stress: Not on file  Social Connections: Not on file  Intimate Partner Violence: Not on file    Outpatient Medications Prior to Visit  Medication Sig Dispense Refill   atorvastatin (LIPITOR) 20 MG tablet Take 1 tablet by mouth once daily. **Please schedule appointment for further refills.** 30 tablet 0   calcium citrate-vitamin D 200-200 MG-UNIT TABS Take 2 tablets by mouth daily.       DOTTI 0.05 MG/24HR patch Place onto the skin.     FLUoxetine (PROZAC) 20 MG capsule Take 1 capsule by mouth daily. 30 capsule 0   ibuprofen (ADVIL,MOTRIN) 200 MG tablet Take 200 mg by mouth as needed. Pain        progesterone (PROMETRIUM) 100 MG capsule      Vitamin D, Ergocalciferol, (DRISDOL) 1.25 MG (50000 UNIT) CAPS capsule TAKE ONE CAPSULE BY MOUTH WEEKLY 12 capsule 0   No facility-administered medications prior to visit.    Allergies  Allergen Reactions   Sulfa Antibiotics  Rash    Facial rash    ROS Review of Systems  Constitutional:  Negative for chills and fever.  HENT:  Positive for congestion and ear pain. Negative for ear discharge, hearing loss, sinus pressure, sore throat and tinnitus.   Neurological:  Negative for headaches.     Objective:    Physical Exam Vitals reviewed.  Constitutional:      Appearance: Normal appearance.  HENT:     Head:     Comments: No parotid swelling.  She does have some mild malalignment of the TMJ joint but no significant tenderness of the left TMJ.    Ears:     Comments: Both right and left eardrums appear normal.  External canals are normal. Cardiovascular:     Rate and Rhythm: Normal rate and regular rhythm.  Musculoskeletal:     Cervical back: Neck supple.  Lymphadenopathy:     Cervical: No cervical adenopathy.  Neurological:     Mental Status: She is alert.     BP 124/80 (BP Location: Left Arm, Patient Position: Sitting, Cuff Size: Normal)    Pulse 95    Temp (!) 97.4 F (36.3 C) (Oral)    Wt 159 lb 14.4 oz (72.5 kg)    LMP 11/15/2011    SpO2 100%    BMI 28.33 kg/m  Wt Readings from Last 3 Encounters:  11/20/21 159 lb 14.4 oz (72.5 kg)  09/07/20 157 lb 14.4 oz (71.6 kg)  11/15/11 142 lb (64.4 kg)     Health Maintenance Due  Topic Date Due   HIV Screening  Never done   Hepatitis C Screening  Never done   COLONOSCOPY (Pts 45-37yrs Insurance coverage will need to be confirmed)  Never done   Zoster Vaccines- Shingrix (1 of 2) Never done   COVID-19 Vaccine (4 - Booster for Pfizer series) 10/24/2020   INFLUENZA VACCINE  Never done    There are no preventive care reminders to display for this patient.  Lab Results  Component Value Date   TSH 1.76 09/07/2020   Lab Results  Component Value Date   WBC 4.9 09/07/2020   HGB 13.4 09/07/2020   HCT 39.4 09/07/2020   MCV 94.7 09/07/2020   PLT 267 09/07/2020   Lab Results  Component Value Date   NA 140 09/07/2020   K 4.2 09/07/2020   CO2 25 09/07/2020   GLUCOSE 92 09/07/2020   BUN 15 09/07/2020   CREATININE 0.70 09/07/2020   BILITOT 0.5 09/07/2020   AST 16 09/07/2020   ALT 21 09/07/2020   PROT 7.2 09/07/2020   CALCIUM 10.0 09/07/2020   Lab Results  Component Value Date   CHOL 262 (H) 09/07/2020   Lab Results  Component Value Date   HDL 55 09/07/2020   Lab Results  Component Value Date   LDLCALC 175 (H) 09/07/2020   Lab Results  Component Value Date   TRIG 167 (H) 09/07/2020   Lab Results  Component Value Date   CHOLHDL 4.8 09/07/2020   Lab Results  Component Value Date   HGBA1C 5.4 09/07/2020      Assessment & Plan:   #1 acute otalgia left ear.  She does not have evidence to suggest otitis externa or otitis media.  No evidence for effusion.  May have some eustachian tube dysfunction related to nasal congestion.  She does not have any red flags such as hearing  loss, unilateral tinnitus, dizziness, etc.  -Focus on clearing nasal congestion with nasal steroid such as  Flonase and oral antihistamine -Touch base of ear symptoms not improving next couple weeks  #2 hyperlipidemia.  Patient was placed by primary on atorvastatin over a year ago and has not had follow-up lipids or hepatic panel since then.  We did go ahead and order lipid and hepatic panel and she is encouraged to follow-up with primary soon.   No orders of the defined types were placed in this encounter.   Follow-up: No follow-ups on file.    Carolann Littler, MD

## 2021-11-21 ENCOUNTER — Ambulatory Visit: Payer: 59 | Admitting: Internal Medicine

## 2021-12-25 ENCOUNTER — Telehealth (INDEPENDENT_AMBULATORY_CARE_PROVIDER_SITE_OTHER): Payer: 59 | Admitting: Internal Medicine

## 2021-12-25 VITALS — Temp 96.2°F

## 2021-12-25 DIAGNOSIS — U071 COVID-19: Secondary | ICD-10-CM | POA: Diagnosis not present

## 2021-12-25 MED ORDER — NIRMATRELVIR/RITONAVIR (PAXLOVID)TABLET: 3.0000 | ORAL_TABLET | Freq: Two times a day (BID) | ORAL | 0 refills | Status: AC

## 2021-12-25 NOTE — Progress Notes (Addendum)
Virtual Visit via Video Note  I connected with Tami Russell on 12/25/21 at  2:30 PM EST by a video enabled telemedicine application and verified that I am speaking with the correct person using two identifiers.  Location patient: home Location provider: work office Persons participating in the virtual visit: patient, provider  I discussed the limitations of evaluation and management by telemedicine and the availability of in person appointments. The patient expressed understanding and agreed to proceed.   HPI: She scheduled this visit to let us know that she has tested positive for COVID.  On Sunday she had a sore throat.  The next day she progressed to having a headache and myalgias.  She also has some mild nasal congestion.  Her and her husband have both tested positive for COVID.   ROS: Constitutional: Denies fever, chills, diaphoresis, appetite change and fatigue.  HEENT: Denies photophobia, eye pain, redness, mouth sores, trouble swallowing, neck pain, neck stiffness and tinnitus.   Respiratory: Denies SOB, DOE, chest tightness,  and wheezing.   Cardiovascular: Denies chest pain, palpitations and leg swelling.  Gastrointestinal: Denies nausea, vomiting, abdominal pain, diarrhea, constipation, blood in stool and abdominal distention.  Genitourinary: Denies dysuria, urgency, frequency, hematuria, flank pain and difficulty urinating.  Endocrine: Denies: hot or cold intolerance, sweats, changes in hair or nails, polyuria, polydipsia. Musculoskeletal: Denies myalgias, back pain, joint swelling, arthralgias and gait problem.  Skin: Denies pallor, rash and wound.  Neurological: Denies dizziness, seizures, syncope, weakness, light-headedness, numbness and headaches.  Hematological: Denies adenopathy. Easy bruising, personal or family bleeding history  Psychiatric/Behavioral: Denies suicidal ideation, mood changes, confusion, nervousness, sleep disturbance and agitation   Past  Medical History:  Diagnosis Date   Depression    takes prozac   Pituitary microadenoma ([place])     No past surgical history on file.  Family History  Problem Relation Age of Onset   CVA Mother    Multiple sclerosis Mother    CVA Father    Diabetes Father     SOCIAL HX:   reports that she quit smoking about 10 years ago. She has never used smokeless tobacco. She reports current alcohol use. She reports that she does not use drugs.   Current Outpatient Medications:    atorvastatin (LIPITOR) 20 MG tablet, Take 1 tablet by mouth once daily. **Please schedule appointment for further refills.**, Disp: 30 tablet, Rfl: 0   calcium citrate-vitamin D 200-200 MG-UNIT TABS, Take 2 tablets by mouth daily.  , Disp: , Rfl:    FLUoxetine (PROZAC) 20 MG capsule, Take 1 capsule by mouth daily., Disp: 30 capsule, Rfl: 0   ibuprofen (ADVIL,MOTRIN) 200 MG tablet, Take 200 mg by mouth as needed. Pain   , Disp: , Rfl:    nirmatrelvir/ritonavir EUA (PAXLOVID) 20 x 150 MG & 10 x 100MG  TABS, Take 3 tablets by mouth 2 (two) times daily for 5 days. (Take nirmatrelvir 150 mg two tablets twice daily for 5 days and ritonavir 100 mg one tablet twice daily for 5 days) Patient GFR is 80, Disp: 30 tablet, Rfl: 0   Vitamin D, Ergocalciferol, (DRISDOL) 1.25 MG (50000 UNIT) CAPS capsule, TAKE ONE CAPSULE BY MOUTH WEEKLY, Disp: 12 capsule, Rfl: 0   DOTTI 0.05 MG/24HR patch, Place onto the skin. (Patient not taking: Reported on 12/25/2021), Disp: , Rfl:    progesterone (PROMETRIUM) 100 MG capsule, , Disp: , Rfl:   EXAM:   VITALS per patient if applicable: None reported  GENERAL: alert, oriented, appears  well and in no acute distress  HEENT: atraumatic, conjunttiva clear, no obvious abnormalities on inspection of external nose and ears  NECK: normal movements of the head and neck  LUNGS: on inspection no signs of respiratory distress, breathing rate appears normal, no obvious gross increased work of breathing, gasping  or wheezing  CV: no obvious cyanosis  MS: moves all visible extremities without noticeable abnormality  PSYCH/NEURO: pleasant and cooperative, no obvious depression or anxiety, speech and thought processing grossly intact  ASSESSMENT AND PLAN:   COVID-19  - Plan: nirmatrelvir/ritonavir EUA (PAXLOVID) 20 x 150 MG & 10 x 100MG  TABS -She may also use OTC medications such as antihistamines, decongestants, pain relievers, guaifenesin. -We have reviewed quarantine period of 5 days. -We have discussed symptoms that would promote ED evaluation. -She knows to follow with [place] if symptoms fail to resolve. -We have discussed holding her atorvastatin while on Paxlovid.     I discussed the assessment and treatment plan with the patient. The patient was provided an opportunity to ask questions and all were answered. The patient agreed with the plan and demonstrated an understanding of the instructions.   The patient was advised to call back or seek an in-person evaluation if the symptoms worsen or if the condition fails to improve as anticipated.    Lelon Frohlich, MD  [facility] at [facility]

## 2021-12-28 ENCOUNTER — Other Ambulatory Visit: Payer: Self-pay | Admitting: Internal Medicine

## 2021-12-28 DIAGNOSIS — E785 Hyperlipidemia, unspecified: Secondary | ICD-10-CM

## 2022-01-01 ENCOUNTER — Other Ambulatory Visit: Payer: Self-pay | Admitting: *Deleted

## 2022-01-01 DIAGNOSIS — E785 Hyperlipidemia, unspecified: Secondary | ICD-10-CM

## 2022-01-01 MED ORDER — ATORVASTATIN CALCIUM 20 MG PO TABS: ORAL_TABLET | ORAL | 0 refills | Status: DC

## 2022-01-01 MED ORDER — FLUOXETINE HCL 20 MG PO CAPS: 20.0000 mg | ORAL_CAPSULE | Freq: Every day | ORAL | 0 refills | Status: DC

## 2022-03-14 ENCOUNTER — Encounter: Payer: Self-pay | Admitting: Gastroenterology

## 2022-03-15 ENCOUNTER — Other Ambulatory Visit: Payer: Self-pay | Admitting: Internal Medicine

## 2022-03-19 ENCOUNTER — Other Ambulatory Visit: Payer: Self-pay | Admitting: Internal Medicine

## 2022-03-19 DIAGNOSIS — E785 Hyperlipidemia, unspecified: Secondary | ICD-10-CM

## 2022-04-04 ENCOUNTER — Ambulatory Visit (AMBULATORY_SURGERY_CENTER): Payer: 59 | Admitting: *Deleted

## 2022-04-04 VITALS — Ht 62.0 in | Wt 160.0 lb

## 2022-04-04 DIAGNOSIS — Z1211 Encounter for screening for malignant neoplasm of colon: Secondary | ICD-10-CM

## 2022-04-04 MED ORDER — SUTAB 1479-225-188 MG PO TABS: 1.0000 | ORAL_TABLET | ORAL | 0 refills | Status: DC

## 2022-04-04 NOTE — Progress Notes (Signed)
No egg or soy allergy known to patient  No issues known to pt with past sedation with any surgeries or procedures Patient denies ever being told they had issues or difficulty with intubation  No FH of Malignant Hyperthermia Pt is not on diet pills Pt is not on  home 02  Pt is not on blood thinners  Pt denies issues with constipation  No A fib or A flutter  Sutab Coupon to pt in PV today , Code to Pharmacy and  NO PA's for preps discussed with pt In PV today  Discussed with pt there will be an out-of-pocket cost for prep and that varies from $0 to 70 +  dollars - pt verbalized understanding    PV completed in person.Pt verified name, DOB, address and insurance during PV today.  Pt given instruction packet with copy of consent form to read and sign after procedure and instructions explained and questions answered. Pt encouraged to call with questions or issues.

## 2022-04-05 ENCOUNTER — Telehealth: Payer: Self-pay | Admitting: Gastroenterology

## 2022-04-05 NOTE — Telephone Encounter (Signed)
Called pt, no answer, left message that I sent new Miralax prep OTC instructions to pt via MyChart.

## 2022-04-05 NOTE — Telephone Encounter (Signed)
Inbound call from patient stating that she has decided that she would rather do the over the counter prep. Patient is requesting new prep instructions. Please advise.

## 2022-04-12 ENCOUNTER — Other Ambulatory Visit: Payer: Self-pay | Admitting: Internal Medicine

## 2022-04-12 DIAGNOSIS — E785 Hyperlipidemia, unspecified: Secondary | ICD-10-CM

## 2022-05-01 ENCOUNTER — Ambulatory Visit (AMBULATORY_SURGERY_CENTER): Payer: Commercial Managed Care - PPO | Admitting: Gastroenterology

## 2022-05-01 ENCOUNTER — Encounter: Payer: Self-pay | Admitting: Gastroenterology

## 2022-05-01 VITALS — BP 107/59 | HR 55 | Temp 96.9°F | Resp 16 | Ht 63.0 in | Wt 160.0 lb

## 2022-05-01 DIAGNOSIS — Z1211 Encounter for screening for malignant neoplasm of colon: Secondary | ICD-10-CM | POA: Diagnosis present

## 2022-05-01 MED ORDER — SODIUM CHLORIDE 0.9 % IV SOLN: 500.0000 mL | Freq: Once | INTRAVENOUS | Status: DC

## 2022-05-01 NOTE — Op Note (Signed)
[facility] Patient Name: Tami Russell Procedure Date: 05/01/2022 7:58 AM MRN: [id number] Endoscopist: Nicki Reaper E. Candis Schatz , MD Age: 56 Referring MD:  Date of Birth: 08/01/1966 Gender: Female Account #: [id number] Procedure:                Colonoscopy Indications:              Screening for colorectal malignant neoplasm, This                            is the patient's first colonoscopy Medicines:                Monitored Anesthesia Care Procedure:                Pre-Anesthesia Assessment:                           - Prior to the procedure, a History and Physical                            was performed, and patient medications and                            allergies were reviewed. The patient's tolerance of                            previous anesthesia was also reviewed. The risks                            and benefits of the procedure and the sedation                            options and risks were discussed with the patient.                            All questions were answered, and informed consent                            was obtained. Prior Anticoagulants: The patient has                            taken no previous anticoagulant or antiplatelet                            agents. ASA Grade Assessment: II - A patient with                            mild systemic disease. After reviewing the risks                            and benefits, the patient was deemed in                            satisfactory condition to undergo the procedure.  After obtaining informed consent, the colonoscope                            was passed under direct vision. Throughout the                            procedure, the patient's blood pressure, pulse, and                            oxygen saturations were monitored continuously. The                            CF [id number] #[id number] was introduced through the anus                            and advanced to the  the terminal ileum, with                            identification of the appendiceal orifice and IC                            valve. The colonoscopy was performed without                            difficulty. The patient tolerated the procedure                            well. The quality of the bowel preparation was                            good. The terminal ileum, ileocecal valve,                            appendiceal orifice, and rectum were photographed.                            The bowel preparation used was Miralax via split                            dose instruction. Scope In: 8:04:27 AM Scope Out: 8:18:55 AM Scope Withdrawal Time: 0 hours 7 minutes 12 seconds  Total Procedure Duration: 0 hours 14 minutes 28 seconds  Findings:                 The perianal and digital rectal examinations were                            normal. Pertinent negatives include normal                            sphincter tone and no palpable rectal lesions.                           The colon (entire examined portion) appeared normal.  The terminal ileum appeared normal.                           The retroflexed view of the distal rectum and anal                            verge was normal and showed no anal or rectal                            abnormalities. Complications:            No immediate complications. Estimated Blood Loss:     Estimated blood loss: none. Impression:               - The entire examined colon is normal.                           - The examined portion of the ileum was normal.                           - The distal rectum and anal verge are normal on                            retroflexion view.                           - No specimens collected. Recommendation:           - Patient has a contact number available for                            emergencies. The signs and symptoms of potential                            delayed complications were  discussed with the                            patient. Return to normal activities tomorrow.                            Written discharge instructions were provided to the                            patient.                           - Resume previous diet.                           - Continue present medications.                           - Repeat colonoscopy in 10 years for screening                            purposes. Dell Hurtubise E. Candis Schatz, MD 05/01/2022 8:26:50 AM This report has been signed electronically.

## 2022-05-01 NOTE — Patient Instructions (Addendum)
YOU HAD AN ENDOSCOPIC PROCEDURE TODAY AT THE [facility]:   Refer to the procedure report that was given to you for any specific questions about what was found during the examination.  If the procedure report does not answer your questions, please call your gastroenterologist to clarify.  If you requested that your care partner not be given the details of your procedure findings, then the procedure report has been included in a sealed envelope for you to review at your convenience later.  YOU SHOULD EXPECT: Some feelings of bloating in the abdomen. Passage of more gas than usual.  Walking can help get rid of the air that was put into your GI tract during the procedure and reduce the bloating. If you had a lower endoscopy (such as a colonoscopy or flexible sigmoidoscopy) you may notice spotting of blood in your stool or on the toilet paper. If you underwent a bowel prep for your procedure, you may not have a normal bowel movement for a few days.  Please Note:  You might notice some irritation and congestion in your nose or some drainage.  This is from the oxygen used during your procedure.  There is no need for concern and it should clear up in a day or so.  SYMPTOMS TO REPORT IMMEDIATELY:  Following lower endoscopy (colonoscopy or flexible sigmoidoscopy):  Excessive amounts of blood in the stool  Significant tenderness or worsening of abdominal pains  Swelling of the abdomen that is new, acute  Fever of 100F or higher  For urgent or emergent issues, a gastroenterologist can be reached at any hour by calling [phone]. Do not use MyChart messaging for urgent concerns.    DIET:  We do recommend a small meal at first, but then you may proceed to your regular diet.  Drink plenty of fluids but you should avoid alcoholic beverages for 24 hours.  ACTIVITY:  You should plan to take it easy for the rest of today and you should NOT DRIVE or use heavy machinery until tomorrow (because of  the sedation medicines used during the test).    FOLLOW UP: Our staff will call the number listed on your records the next business day following your procedure.  We will call around 7:15- 8:00 am to check on you and address any questions or concerns that you may have regarding the information given to you following your procedure. If we do not reach you, we will leave a message.  If you develop any symptoms (ie: fever, flu-like symptoms, shortness of breath, cough etc.) before then, please call [phone].  If you test positive for Covid 19 in the 2 weeks post procedure, please call and report this information to us.    If any biopsies were taken you will be contacted by phone or by letter within the next 1-3 weeks.  Please call us at [phone] if you have not heard about the biopsies in 3 weeks.    SIGNATURES/CONFIDENTIALITY: You and/or your care partner have signed paperwork which will be entered into your electronic medical record.  These signatures attest to the fact that that the information above on your After Visit Summary has been reviewed and is understood.  Full responsibility of the confidentiality of this discharge information lies with you and/or your care-partner.  

## 2022-05-01 NOTE — Progress Notes (Signed)
[name] History and Physical   Primary Care Physician:  Isaac Bliss, Rayford Halsted, MD   Reason for Procedure:   Colon cancer screening  Plan:    Screening colonoscopy     HPI: Tami Russell is a 56 y.o. female undergoing initial average risk screening colonoscopy.  She has no family history of colon cancer and no chronic GI symptoms.    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 Authorizing Provider  atorvastatin (LIPITOR) 20 MG tablet Take 1 tablet by mouth once daily. **Please schedule appointment for further refills.** 03/19/22  Yes Isaac Bliss, Rayford Halsted, MD  calcium citrate-vitamin D 200-200 MG-UNIT TABS Take 2 tablets by mouth daily.     Yes [provider]  DOTTI 0.05 MG/24HR patch Place onto the skin. 08/24/20  Yes [provider]  FLUoxetine (PROZAC) 20 MG capsule Take 1 capsule by mouth daily. **Please schedule a physical for more refills.** 03/18/22  Yes Isaac Bliss, Rayford Halsted, MD  progesterone (PROMETRIUM) 100 MG capsule  08/24/20  Yes [provider]  ibuprofen (ADVIL,MOTRIN) 200 MG tablet Take 200 mg by mouth as needed. Pain       [provider]    Current Outpatient Medications  Medication Sig Dispense Refill   atorvastatin (LIPITOR) 20 MG tablet Take 1 tablet by mouth once daily. **Please schedule appointment for further refills.** 30 tablet 0   calcium citrate-vitamin D 200-200 MG-UNIT TABS Take 2 tablets by mouth daily.       DOTTI 0.05 MG/24HR patch Place onto the skin.     FLUoxetine (PROZAC) 20 MG capsule Take 1 capsule by mouth daily. **Please schedule a physical for more refills.** 30 capsule 0   progesterone (PROMETRIUM)  100 MG capsule      ibuprofen (ADVIL,MOTRIN) 200 MG tablet Take 200 mg by mouth as needed. Pain        Current Facility-Administered Medications  Medication Dose Route Frequency Provider Last Rate Last Admin   0.9 %  sodium chloride infusion  500 mL Intravenous Once Daryel November, MD        Allergies as of 05/01/2022 - Review Complete 05/01/2022  Allergen Reaction Noted   Sulfa antibiotics Rash 11/07/2011    Family History  Problem Relation Age of Onset   CVA Mother    Multiple sclerosis Mother    CVA Father    Diabetes Father    Colon cancer Neg Hx    Colon polyps Neg Hx    Esophageal cancer Neg Hx    Ulcerative colitis Neg Hx    Stomach cancer Neg Hx     Social History   Socioeconomic History   Marital status: Married    Spouse name: Not on file   Number of children: Not on file   Years of education: Not on file   Highest education level: Not on file  Occupational History   Not on file  Tobacco Use   Smoking status: Former    Types: Cigarettes    Quit date: 01/05/2011    Years since quitting: 11.3   Smokeless tobacco: Never  Vaping Use   Vaping Use: Never used  Substance and  Sexual Activity   Alcohol use: Yes    Comment: occasional   Drug use: No   Sexual activity: Yes    Birth control/protection: None  Other Topics Concern   Not on file  Social History Narrative   Not on file   Social Determinants of Health   Financial Resource Strain: Not on file  Food Insecurity: Not on file  Transportation Needs: Not on file  Physical Activity: Not on file  Stress: Not on file  Social Connections: Not on file  Intimate Partner Violence: Not on file    Review of Systems:  All other review of systems negative except as mentioned in the HPI.  Physical Exam: Vital signs BP (!) 145/77   Pulse 72   Temp (!) 96.9 F (36.1 C)   Ht '5\' 3"'$  (1.6 m)   Wt 160 lb (72.6 kg)   LMP 11/15/2011   SpO2 96%   BMI 28.34 kg/m   General:   Alert,  Well-developed,  well-nourished, pleasant and cooperative in NAD Airway:  Mallampati 2 Lungs:  Clear throughout to auscultation.   Heart:  Regular rate and rhythm; no murmurs, clicks, rubs,  or gallops. Abdomen:  Soft, nontender and nondistended. Normal bowel sounds.   Neuro/Psych:  Normal mood and affect. A and O x 3   Rivers Gassmann E. Candis Schatz, MD [facility]

## 2022-05-01 NOTE — Progress Notes (Signed)
To pacu, VSS. Report to Rn.tb 

## 2022-05-01 NOTE — Progress Notes (Signed)
Pt's states no medical or surgical changes since previsit or office visit. 

## 2022-05-02 ENCOUNTER — Telehealth: Payer: Self-pay

## 2022-05-02 NOTE — Telephone Encounter (Signed)
Post procedure follow up call, no answer 

## 2022-05-30 ENCOUNTER — Telehealth: Payer: Self-pay | Admitting: Internal Medicine

## 2022-05-30 DIAGNOSIS — E785 Hyperlipidemia, unspecified: Secondary | ICD-10-CM

## 2022-05-30 MED ORDER — ATORVASTATIN CALCIUM 20 MG PO TABS: ORAL_TABLET | ORAL | 0 refills | Status: DC

## 2022-05-30 NOTE — Telephone Encounter (Signed)
Pt requesting refill atorvastatin (LIPITOR) 20 MG tablet   [place], [place] Phone:  [phone]  Fax:  [phone]

## 2022-06-07 ENCOUNTER — Other Ambulatory Visit: Payer: Self-pay | Admitting: Internal Medicine

## 2022-07-07 ENCOUNTER — Other Ambulatory Visit: Payer: Self-pay | Admitting: Internal Medicine

## 2022-08-07 ENCOUNTER — Other Ambulatory Visit: Payer: Self-pay | Admitting: Internal Medicine

## 2022-09-01 ENCOUNTER — Other Ambulatory Visit: Payer: Self-pay | Admitting: Internal Medicine

## 2022-09-01 DIAGNOSIS — E785 Hyperlipidemia, unspecified: Secondary | ICD-10-CM

## 2022-09-06 ENCOUNTER — Other Ambulatory Visit: Payer: Self-pay | Admitting: Internal Medicine

## 2022-10-01 ENCOUNTER — Other Ambulatory Visit: Payer: Self-pay | Admitting: Internal Medicine

## 2022-10-01 DIAGNOSIS — E785 Hyperlipidemia, unspecified: Secondary | ICD-10-CM

## 2022-10-07 ENCOUNTER — Other Ambulatory Visit: Payer: Self-pay | Admitting: Internal Medicine

## 2022-10-25 LAB — HM MAMMOGRAPHY

## 2022-10-25 LAB — HM PAP SMEAR

## 2022-10-27 ENCOUNTER — Other Ambulatory Visit: Payer: Self-pay | Admitting: Internal Medicine

## 2022-10-27 DIAGNOSIS — E785 Hyperlipidemia, unspecified: Secondary | ICD-10-CM

## 2022-11-02 ENCOUNTER — Other Ambulatory Visit: Payer: Self-pay | Admitting: Internal Medicine

## 2022-11-12 ENCOUNTER — Other Ambulatory Visit: Payer: Self-pay | Admitting: Internal Medicine

## 2022-11-12 DIAGNOSIS — E785 Hyperlipidemia, unspecified: Secondary | ICD-10-CM

## 2022-12-02 ENCOUNTER — Other Ambulatory Visit: Payer: Self-pay | Admitting: Internal Medicine

## 2022-12-07 ENCOUNTER — Other Ambulatory Visit: Payer: Self-pay | Admitting: Internal Medicine

## 2022-12-07 DIAGNOSIS — E785 Hyperlipidemia, unspecified: Secondary | ICD-10-CM

## 2022-12-11 ENCOUNTER — Ambulatory Visit (INDEPENDENT_AMBULATORY_CARE_PROVIDER_SITE_OTHER): Payer: Commercial Managed Care - PPO | Admitting: Internal Medicine

## 2022-12-11 ENCOUNTER — Encounter: Payer: Self-pay | Admitting: Internal Medicine

## 2022-12-11 VITALS — BP 130/80 | HR 85 | Temp 98.6°F | Wt 159.1 lb

## 2022-12-11 DIAGNOSIS — E785 Hyperlipidemia, unspecified: Secondary | ICD-10-CM

## 2022-12-11 DIAGNOSIS — F3342 Major depressive disorder, recurrent, in full remission: Secondary | ICD-10-CM | POA: Diagnosis not present

## 2022-12-11 MED ORDER — ATORVASTATIN CALCIUM 20 MG PO TABS: 20.0000 mg | ORAL_TABLET | Freq: Every day | ORAL | 1 refills | Status: DC

## 2022-12-11 MED ORDER — FLUOXETINE HCL 20 MG PO CAPS: ORAL_CAPSULE | ORAL | 1 refills | Status: DC

## 2022-12-11 NOTE — Progress Notes (Signed)
Established Patient Office Visit     CC/Reason for Visit: Medication refills  HPI: Tami Russell is a 57 y.o. female who is coming in today for the above mentioned reasons. Past Medical History is significant for: Hyperlipidemia and depression.  She is needing refills of her fluoxetine and atorvastatin and was asked to schedule a visit as we have not seen her in over a year.  She is feeling well and has no acute concerns.   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 Her smoking use included cigarettes. She has never used smokeless tobacco. She reports current alcohol use. She reports that she does not use drugs.  Allergies: Allergies  Allergen Reactions   Sulfa Antibiotics Rash    Facial rash    Family History:  Family History  Problem Relation Age of Onset   CVA Mother    Multiple sclerosis Mother    CVA Father    Diabetes Father    Colon cancer Neg Hx    Colon polyps Neg Hx    Esophageal cancer Neg Hx    Ulcerative colitis Neg Hx    Stomach cancer Neg Hx      Current Outpatient Medications:    calcium citrate-vitamin D 200-200 MG-UNIT TABS, Take 2 tablets by mouth daily.  , Disp: , Rfl:    DOTTI 0.05 MG/24HR patch, Place onto the skin., Disp: , Rfl:    ibuprofen (ADVIL,MOTRIN) 200 MG tablet, Take 200 mg by mouth as needed. Pain   , Disp: , Rfl:    progesterone (PROMETRIUM) 100 MG capsule, , Disp: , Rfl:    atorvastatin (LIPITOR) 20 MG tablet, Take 1 tablet (20 mg total) by mouth at bedtime., Disp: 90 tablet, Rfl: 1   FLUoxetine (PROZAC) 20 MG capsule, Take 1 capsule by mouth daily., Disp: 90 capsule, Rfl: 1  Review of Systems:  Negative unless indicated in  HPI.   Physical Exam: Vitals:   12/11/22 1336  BP: 130/80  Pulse: 85  Temp: 98.6 F (37 C)  TempSrc: Oral  SpO2: 96%  Weight: 159 lb 1.6 oz (72.2 kg)    Body mass index is 28.18 kg/m.   Physical Exam Vitals reviewed.  Constitutional:      Appearance: Normal appearance.  HENT:     Head: Normocephalic and atraumatic.  Eyes:     Conjunctiva/sclera: Conjunctivae normal.     Pupils: Pupils are equal, round, and reactive to light.  Cardiovascular:     Rate and Rhythm: Normal rate and regular rhythm.  Pulmonary:     Effort: Pulmonary effort is normal.     Breath sounds: Normal breath sounds.  Skin:    General: Skin is warm and dry.  Neurological:     General: No focal deficit present.     Mental Status: She is alert and oriented to person, place, and time.  Psychiatric:        Mood and Affect: Mood normal.        Behavior: Behavior normal.        Thought Content: Thought content normal.        Judgment: Judgment normal.  Impression and Plan:  Recurrent major depressive disorder, in full remission ([place]) - Plan: FLUoxetine (PROZAC) 20 MG capsule  Hyperlipidemia, unspecified hyperlipidemia type - Plan: atorvastatin (LIPITOR) 20 MG tablet  -Mood is stable, continue fluoxetine, will also send in refills of atorvastatin. -Advised to schedule appointment for annual physical to discuss all preventative health care.  Time spent:22 minutes reviewing chart, interviewing and examining patient and formulating plan of care.     Lelon Frohlich, MD [facility]

## 2023-02-26 ENCOUNTER — Telehealth (HOSPITAL_BASED_OUTPATIENT_CLINIC_OR_DEPARTMENT_OTHER): Payer: Self-pay | Admitting: *Deleted

## 2023-02-26 ENCOUNTER — Ambulatory Visit (INDEPENDENT_AMBULATORY_CARE_PROVIDER_SITE_OTHER): Payer: Commercial Managed Care - PPO | Admitting: Internal Medicine

## 2023-02-26 ENCOUNTER — Telehealth: Payer: Self-pay | Admitting: Internal Medicine

## 2023-02-26 ENCOUNTER — Encounter: Payer: Self-pay | Admitting: Internal Medicine

## 2023-02-26 VITALS — BP 120/84 | HR 67 | Temp 98.0°F | Ht 63.0 in | Wt 157.2 lb

## 2023-02-26 DIAGNOSIS — R011 Cardiac murmur, unspecified: Secondary | ICD-10-CM

## 2023-02-26 DIAGNOSIS — Z0000 Encounter for general adult medical examination without abnormal findings: Secondary | ICD-10-CM | POA: Diagnosis not present

## 2023-02-26 DIAGNOSIS — E785 Hyperlipidemia, unspecified: Secondary | ICD-10-CM | POA: Diagnosis not present

## 2023-02-26 DIAGNOSIS — F3342 Major depressive disorder, recurrent, in full remission: Secondary | ICD-10-CM | POA: Diagnosis not present

## 2023-02-26 DIAGNOSIS — E559 Vitamin D deficiency, unspecified: Secondary | ICD-10-CM | POA: Diagnosis not present

## 2023-02-26 DIAGNOSIS — Z114 Encounter for screening for human immunodeficiency virus [HIV]: Secondary | ICD-10-CM

## 2023-02-26 DIAGNOSIS — Z1159 Encounter for screening for other viral diseases: Secondary | ICD-10-CM

## 2023-02-26 LAB — COMPREHENSIVE METABOLIC PANEL
ALT: 20 U/L (ref 0–35)
AST: 18 U/L (ref 0–37)
Albumin: 4.6 g/dL (ref 3.5–5.2)
Alkaline Phosphatase: 90 U/L (ref 39–117)
BUN: 12 mg/dL (ref 6–23)
CO2: 23 mEq/L (ref 19–32)
Calcium: 9.5 mg/dL (ref 8.4–10.5)
Chloride: 105 mEq/L (ref 96–112)
Creatinine, Ser: 0.76 mg/dL (ref 0.40–1.20)
GFR: 87.85 mL/min (ref >60.00)
Glucose, Bld: 115 mg/dL — ABNORMAL HIGH (ref 70–99)
Potassium: 3.6 mEq/L (ref 3.5–5.1)
Sodium: 142 mEq/L (ref 135–145)
Total Bilirubin: 0.5 mg/dL (ref 0.2–1.2)
Total Protein: 6.9 g/dL (ref 6.0–8.3)

## 2023-02-26 LAB — VITAMIN D 25 HYDROXY (VIT D DEFICIENCY, FRACTURES): VITD: 26.92 ng/mL — ABNORMAL LOW (ref 30.00–100.00)

## 2023-02-26 LAB — CBC WITH DIFFERENTIAL/PLATELET
Basophils Absolute: 0 10*3/uL (ref 0.0–0.1)
Basophils Relative: 0.8 % (ref 0.0–3.0)
Eosinophils Absolute: 0.1 10*3/uL (ref 0.0–0.7)
Eosinophils Relative: 2.8 % (ref 0.0–5.0)
HCT: 40.6 % (ref 36.0–46.0)
Hemoglobin: 13.9 g/dL (ref 12.0–15.0)
Lymphocytes Relative: 30.3 % (ref 12.0–46.0)
Lymphs Abs: 1.6 10*3/uL (ref 0.7–4.0)
MCHC: 34.1 g/dL (ref 30.0–36.0)
MCV: 93.2 fl (ref 78.0–100.0)
Monocytes Absolute: 0.4 10*3/uL (ref 0.1–1.0)
Monocytes Relative: 6.8 % (ref 3.0–12.0)
Neutro Abs: 3.1 10*3/uL (ref 1.4–7.7)
Neutrophils Relative %: 59.3 % (ref 43.0–77.0)
Platelets: 247 10*3/uL (ref 150.0–400.0)
RBC: 4.36 Mil/uL (ref 3.87–5.11)
RDW: 13.2 % (ref 11.5–15.5)
WBC: 5.3 10*3/uL (ref 4.0–10.5)

## 2023-02-26 LAB — VITAMIN B12: Vitamin B-12: 173 pg/mL — ABNORMAL LOW (ref 211–911)

## 2023-02-26 LAB — LIPID PANEL
Cholesterol: 190 mg/dL (ref 0–200)
HDL: 58.1 mg/dL (ref >39.00)
NonHDL: 132.22
Total CHOL/HDL Ratio: 3
Triglycerides: 275 mg/dL — ABNORMAL HIGH (ref 0.0–149.0)
VLDL: 55 mg/dL — ABNORMAL HIGH (ref 0.0–40.0)

## 2023-02-26 LAB — TSH: TSH: 2.81 u[IU]/mL (ref 0.35–5.50)

## 2023-02-26 LAB — LDL CHOLESTEROL, DIRECT: Direct LDL: 109 mg/dL

## 2023-02-26 NOTE — Telephone Encounter (Signed)
Spoke with the patient and she states that the Echo will cost $17,000.  She called Dr. Verl Dicker office and they can do it for $320.  Please advise

## 2023-02-26 NOTE — Telephone Encounter (Signed)
Referral placed.

## 2023-02-26 NOTE — Progress Notes (Signed)
Established Patient Office Visit     CC/Reason for Visit: Annual preventive exam and discuss acute concern  HPI: Tami Russell is a 57 y.o. female who is coming in today for the above mentioned reasons. Past Medical History is significant for: Hyperlipidemia and depression.  She is feeling well today she has routine eye and dental care.  She is overdue for COVID-vaccine but on immunizations are up-to-date.  She had a colonoscopy in 2023, she had a recent visit with GYN and will obtain records.  She plays tennis.  Sometimes when she plays really hard she feels like she might pass out.  She sometimes wakes up in the middle of the night and feels like her heart has skipped a beat.   Past Medical/Surgical History: Past Medical History:  Diagnosis Date   Depression    takes prozac   Hyperlipidemia    Pituitary microadenoma     Past Surgical History:  Procedure Laterality Date   ENDOMETRIAL ABLATION W/ [place]     KNEE SURGERY  2018    Social History:  reports that she quit smoking about 12 years ago. Her smoking use included cigarettes. She has never used smokeless tobacco. She reports current alcohol use. She reports that she does not use drugs.  Allergies: Allergies  Allergen Reactions   Sulfa Antibiotics Rash    Facial rash    Family History:  Family History  Problem Relation Age of Onset   CVA Mother    Multiple sclerosis Mother    CVA Father    Diabetes Father    Colon cancer Neg Hx    Colon polyps Neg Hx    Esophageal cancer Neg Hx    Ulcerative colitis Neg Hx    Stomach cancer Neg Hx      Current Outpatient Medications:    atorvastatin (LIPITOR) 20 MG tablet, Take 1 tablet (20 mg total) by mouth at bedtime., Disp: 90 tablet, Rfl: 1   calcium citrate-vitamin D 200-200 MG-UNIT TABS, Take 2 tablets by mouth daily.  , Disp: , Rfl:    DOTTI 0.05 MG/24HR patch, Place onto the skin., Disp: , Rfl:    FLUoxetine (PROZAC) 20 MG capsule, Take 1 capsule by  mouth daily., Disp: 90 capsule, Rfl: 1   ibuprofen (ADVIL,MOTRIN) 200 MG tablet, Take 200 mg by mouth as needed. Pain   , Disp: , Rfl:    progesterone (PROMETRIUM) 100 MG capsule, , Disp: , Rfl:   Review of Systems:  Negative unless indicated in HPI.   Physical Exam: Vitals:   02/26/23 0830  BP: 120/84  Pulse: 67  Temp: 98 F (36.7 C)  TempSrc: Oral  SpO2: 98%  Weight: 157 lb 3.2 oz (71.3 kg)  Height:  (1.6 m)    Body mass index is 27.85 kg/m.   Physical Exam Vitals reviewed.  Constitutional:      General: She is not in acute distress.    Appearance: Normal appearance. She is not ill-appearing, toxic-appearing or diaphoretic.  HENT:     Head: Normocephalic.     Right Ear: Tympanic membrane, ear canal and external ear normal. There is no impacted cerumen.     Left Ear: Tympanic membrane, ear canal and external ear normal. There is no impacted cerumen.     Nose: Nose normal.     Mouth/Throat:     Mouth: Mucous membranes are moist.     Pharynx: Oropharynx is clear. No oropharyngeal exudate or posterior oropharyngeal erythema.  Eyes:     General: No scleral icterus.       Right eye: No discharge.        Left eye: No discharge.     Conjunctiva/sclera: Conjunctivae normal.     Pupils: Pupils are equal, round, and reactive to light.  Neck:     Vascular: No carotid bruit.  Cardiovascular:     Rate and Rhythm: Normal rate and regular rhythm.     Pulses: Normal pulses.     Heart sounds: Murmur heard.  Pulmonary:     Effort: Pulmonary effort is normal. No respiratory distress.     Breath sounds: Normal breath sounds.  Abdominal:     General: Abdomen is flat. Bowel sounds are normal.     Palpations: Abdomen is soft.  Musculoskeletal:        General: Normal range of motion.     Cervical back: Normal range of motion.  Skin:    General: Skin is warm and dry.  Neurological:     General: No focal deficit present.     Mental Status: She is alert and oriented to  person, place, and time. Mental status is at baseline.  Psychiatric:        Mood and Affect: Mood normal.        Behavior: Behavior normal.        Thought Content: Thought content normal.        Judgment: Judgment normal.     Flowsheet Row Office Visit from 02/26/2023 in [facility] [place]  PHQ-9 Total Score 1        Impression and Plan:  Encounter for preventive health examination  Hyperlipidemia, unspecified hyperlipidemia type - Plan: Lipid panel  Vitamin D deficiency - Plan: Vitamin D, 25-hydroxy  Systolic murmur - Plan: ECHOCARDIOGRAM COMPLETE  Recurrent major depressive disorder, in full remission - Plan: CBC with Differential/Platelet, Comprehensive metabolic panel, TSH, Vitamin B12  Encounter for hepatitis C screening test for low risk patient - Plan: Hepatitis C antibody  Encounter for screening for HIV - Plan: HIV Antibody (routine testing w rflx)  -Recommend routine eye and dental care. -Healthy lifestyle discussed in detail. -Labs to be updated today. -Prostate cancer screening: N/A Health Maintenance  Topic Date Due   HIV Screening  Never done   Hepatitis C Screening: USPSTF Recommendation to screen - Ages 41-79 yo.  Never done   COVID-19 Vaccine (4 - 2023-24 season) 07/05/2022   Flu Shot  06/05/2023   Pap Smear  09/08/2023   Mammogram  09/09/2023   DTaP/Tdap/Td vaccine (2 - Td or Tdap) 02/23/2024   Colon Cancer Screening  05/01/2032   Zoster (Shingles) Vaccine  Completed   HPV Vaccine  Aged Out   -Advised to update COVID-vaccine. -Obtain records from GYN in regards to Pap and mammogram. -I am documenting a systolic murmur today, sent for echocardiogram.       Chaya Jan, MD [facility]

## 2023-02-26 NOTE — Telephone Encounter (Signed)
Left message for patient to call and schedule the echocardiogram ordered by Dr. Ardyth Harps

## 2023-02-26 NOTE — Telephone Encounter (Addendum)
Pt called to say Re: Echo referral - Location is way too expensive  Pt would like a new referral   Preferably to see:    Dr. Jacinto Halim  (Cardiology)  Fax  [phone]

## 2023-02-27 ENCOUNTER — Encounter: Payer: Self-pay | Admitting: Internal Medicine

## 2023-02-27 LAB — HIV ANTIBODY (ROUTINE TESTING W REFLEX): HIV 1&2 Ab, 4th Generation: NONREACTIVE

## 2023-02-27 LAB — HEPATITIS C ANTIBODY: Hepatitis C Ab: NONREACTIVE

## 2023-02-27 NOTE — Telephone Encounter (Signed)
Order was faxed and confirmed.

## 2023-02-27 NOTE — Telephone Encounter (Signed)
Echo order was placed

## 2023-02-27 NOTE — Addendum Note (Signed)
Addended by: Kern Reap B on: 02/27/2023 11:59 AM   Modules accepted: Orders

## 2023-02-27 NOTE — Telephone Encounter (Signed)
Pt states referral [phone] is for a OV and not the procedure. Please advise

## 2023-02-28 NOTE — Telephone Encounter (Signed)
Spoke with patient to schedule Echocardiogram ---patient states she does not wish to have this study done.

## 2023-03-03 ENCOUNTER — Encounter: Payer: Self-pay | Admitting: Internal Medicine

## 2023-03-03 ENCOUNTER — Other Ambulatory Visit: Payer: Self-pay | Admitting: Internal Medicine

## 2023-03-03 DIAGNOSIS — E559 Vitamin D deficiency, unspecified: Secondary | ICD-10-CM

## 2023-03-03 DIAGNOSIS — E538 Deficiency of other specified B group vitamins: Secondary | ICD-10-CM | POA: Insufficient documentation

## 2023-03-03 MED ORDER — VITAMIN D (ERGOCALCIFEROL) 1.25 MG (50000 UNIT) PO CAPS
50000.0000 [IU] | ORAL_CAPSULE | ORAL | 0 refills | Status: AC
Start: 2023-03-03 — End: 2023-05-20

## 2023-03-24 ENCOUNTER — Ambulatory Visit: Payer: Commercial Managed Care - PPO | Admitting: Cardiology

## 2023-04-02 ENCOUNTER — Ambulatory Visit: Payer: Commercial Managed Care - PPO

## 2023-04-02 DIAGNOSIS — R011 Cardiac murmur, unspecified: Secondary | ICD-10-CM

## 2023-05-09 ENCOUNTER — Ambulatory Visit: Payer: Self-pay | Admitting: Family Medicine

## 2023-05-16 ENCOUNTER — Other Ambulatory Visit: Payer: Self-pay | Admitting: Internal Medicine

## 2023-05-16 DIAGNOSIS — E785 Hyperlipidemia, unspecified: Secondary | ICD-10-CM

## 2023-05-29 ENCOUNTER — Encounter: Payer: Self-pay | Admitting: Internal Medicine

## 2023-08-03 ENCOUNTER — Other Ambulatory Visit: Payer: Self-pay | Admitting: Internal Medicine

## 2023-08-03 DIAGNOSIS — F3342 Major depressive disorder, recurrent, in full remission: Secondary | ICD-10-CM

## 2023-08-27 ENCOUNTER — Ambulatory Visit: Payer: Commercial Managed Care - PPO | Admitting: Internal Medicine

## 2023-11-12 ENCOUNTER — Other Ambulatory Visit: Payer: Self-pay | Admitting: Internal Medicine

## 2023-11-12 DIAGNOSIS — F3342 Major depressive disorder, recurrent, in full remission: Secondary | ICD-10-CM

## 2023-11-12 DIAGNOSIS — E785 Hyperlipidemia, unspecified: Secondary | ICD-10-CM

## 2023-12-17 LAB — HM MAMMOGRAPHY

## 2024-02-14 ENCOUNTER — Other Ambulatory Visit: Payer: Self-pay | Admitting: Internal Medicine

## 2024-02-14 DIAGNOSIS — E785 Hyperlipidemia, unspecified: Secondary | ICD-10-CM

## 2024-02-18 ENCOUNTER — Ambulatory Visit: Payer: Self-pay

## 2024-02-18 NOTE — Telephone Encounter (Signed)
 noted

## 2024-02-18 NOTE — Telephone Encounter (Signed)
 Copied from CRM [id number]. Topic: Clinical - Red Word Triage >> Feb 18, 2024  7:37 AM [place] A wrote: [facility] that prompted transfer to Nurse Triage: Cough, Sore throat,headache and body aches and fever-patient is currently in [place]     Chief Complaint: Pt. Currently in [place] . Has cough, fever, body aches, SOB with cough.  Symptoms: Above, concerned she has COVID. Frequency: Monday Pertinent Negatives: Patient denies  Disposition: [] ED /[x] Urgent Care (no appt availability in office) / [] Appointment(In office/virtual)/ []  [facility]/ [] Home Care/ [] Refused Recommended Disposition /[] [facility]/ []  Follow-up with PCP Additional Notes: Agrees with UC.  Reason for Disposition  [1] MILD difficulty breathing (e.g., minimal/no SOB at rest, SOB with walking, pulse <100) AND [2] still present when not coughing  Answer Assessment - Initial Assessment Questions 1. ONSET: "When did the cough begin?"      Monday 2. SEVERITY: "How bad is the cough today?"      Moderate 3. SPUTUM: "Describe the color of your sputum" (none, dry cough; clear, white, yellow, green)     Yellow 4. HEMOPTYSIS: "Are you coughing up any blood?" If so ask: "How much?" (flecks, streaks, tablespoons, etc.)     no 5. DIFFICULTY BREATHING: "Are you having difficulty breathing?" If Yes, ask: "How bad is it?" (e.g., mild, moderate, severe)    - MILD: No SOB at rest, mild SOB with walking, speaks normally in sentences, can lie down, no retractions, pulse < 100.    - MODERATE: SOB at rest, SOB with minimal exertion and prefers to sit, cannot lie down flat, speaks in phrases, mild retractions, audible wheezing, pulse 100-120.    - SEVERE: Very SOB at rest, speaks in single words, struggling to breathe, sitting hunched forward, retractions, pulse > 120      Mild 6. FEVER: "Do you have a fever?" If Yes, ask: "What is your temperature, how was it measured, and when did it start?"     Yes 7. CARDIAC  HISTORY: "Do you have any history of heart disease?" (e.g., heart attack, congestive heart failure)      Yes 8. LUNG HISTORY: "Do you have any history of lung disease?"  (e.g., pulmonary embolus, asthma, emphysema)     No 9. PE RISK FACTORS: "Do you have a history of blood clots?" (or: recent major surgery, recent prolonged travel, bedridden)     No 10. OTHER SYMPTOMS: "Do you have any other symptoms?" (e.g., runny nose, wheezing, chest pain)       Body aches 11. PREGNANCY: "Is there any chance you are pregnant?" "When was your last menstrual period?"       No 12. TRAVEL: "Have you traveled out of the country in the last month?" (e.g., travel history, exposures)       no  Protocols used: Cough - Acute Productive-A-AH

## 2024-04-13 ENCOUNTER — Other Ambulatory Visit: Payer: Self-pay | Admitting: Internal Medicine

## 2024-04-13 DIAGNOSIS — E785 Hyperlipidemia, unspecified: Secondary | ICD-10-CM

## 2024-05-20 ENCOUNTER — Other Ambulatory Visit: Payer: Self-pay | Admitting: Internal Medicine

## 2024-05-20 DIAGNOSIS — F3342 Major depressive disorder, recurrent, in full remission: Secondary | ICD-10-CM

## 2024-06-10 ENCOUNTER — Encounter: Payer: Self-pay | Admitting: Family Medicine

## 2024-06-10 ENCOUNTER — Ambulatory Visit: Payer: Self-pay

## 2024-06-10 ENCOUNTER — Ambulatory Visit (INDEPENDENT_AMBULATORY_CARE_PROVIDER_SITE_OTHER): Admitting: Family Medicine

## 2024-06-10 VITALS — BP 128/84 | HR 70 | Temp 98.1°F | Ht 63.0 in | Wt 162.0 lb

## 2024-06-10 DIAGNOSIS — R519 Headache, unspecified: Secondary | ICD-10-CM | POA: Diagnosis not present

## 2024-06-10 DIAGNOSIS — H6690 Otitis media, unspecified, unspecified ear: Secondary | ICD-10-CM

## 2024-06-10 MED ORDER — PREDNISONE 10 MG PO TABS
ORAL_TABLET | ORAL | 0 refills | Status: AC
Start: 2024-06-10 — End: 2024-06-16

## 2024-06-10 MED ORDER — AMOXICILLIN-POT CLAVULANATE 875-125 MG PO TABS: 1.0000 | ORAL_TABLET | Freq: Two times a day (BID) | ORAL | 0 refills | Status: AC

## 2024-06-10 NOTE — Patient Instructions (Addendum)
-  It was a pleasure to care for you today.  -Prescribed Prednisone  10mg  tablet, 6 day taper for ear infection and headache described as pressure. Recommend to not take any additional NSAIDS, such as Ibuprofen, Aleve, Advil, or Naproxen while taking medication. May take Tylenol .  -Stop taking Ciprofloxacin-Dexamethasone  ear drops. Prescribed Augmentin  875-125mg  tablet, take 1 tablet every 12 hours for 7 days.  -If not improved in 1 week or symptoms become worse, please follow up with PCP or another provider in the office.

## 2024-06-10 NOTE — Telephone Encounter (Signed)
 Noted

## 2024-06-10 NOTE — Progress Notes (Signed)
 Acute Office Visit   Subjective:  Patient ID: Tami Russell, female    DOB: 05/25/1966, 58 y.o.   MRN: [id number]  Chief Complaint  Patient presents with   Ear Pain    HPI Patient is complaining of left ear pain. Started Sunday. She went to [facility] on Monday, 08/04 for ear pain. Diagnosed with acute swimmers ear. Prescribed Ciprofloxacin-Dexamethasone  ear drops. She has been taking these. She called back to urgent care today because she had not improved and was referred to follow back up with PCP. Her PCP, Dr. Theophilus is not in office today.   She reports the pain has become worse and pain radiating down to left posterior neck. Possibly low grade fever. Left side headache that feels like pressure.   Denies any ear drainage, sore throat, nasal congestion or drainage.   She [place] and has to put alcohol in her ear when swimming. Denies swimming this weekend.  ROS See HPI above      Objective:   BP 128/84   Pulse 70   Temp 98.1 F (36.7 C) (Oral)   Ht 5' 3 (1.6 m)   Wt 162 lb (73.5 kg)   LMP 11/15/2011   SpO2 95%   BMI 28.70 kg/m    Physical Exam Vitals reviewed.  Constitutional:      General: She is not in acute distress.    Appearance: Normal appearance. She is not ill-appearing, toxic-appearing or diaphoretic.  HENT:     Head: Normocephalic and atraumatic.     Right Ear: Tympanic membrane, ear canal and external ear normal. No drainage. There is no impacted cerumen. Tympanic membrane is not perforated, erythematous or bulging.     Left Ear: Tympanic membrane, ear canal and external ear normal. No drainage. There is no impacted cerumen. Tympanic membrane is not perforated, erythematous or bulging.     Nose:     Right Sinus: No maxillary sinus tenderness or frontal sinus tenderness.     Left Sinus: No maxillary sinus tenderness or frontal sinus tenderness.     Mouth/Throat:      Pharynx: Oropharynx is clear. Uvula midline. No pharyngeal swelling, oropharyngeal exudate, posterior oropharyngeal erythema, uvula swelling or postnasal drip.  Eyes:     General:        Right eye: No discharge.        Left eye: No discharge.     Conjunctiva/sclera: Conjunctivae normal.  Cardiovascular:     Rate and Rhythm: Normal rate and regular rhythm.     Heart sounds: Normal heart sounds. No murmur heard.    No friction rub. No gallop.  Pulmonary:     Effort: Pulmonary effort is normal. No respiratory distress.     Breath sounds: Normal breath sounds.  Musculoskeletal:        General: Normal range of motion.  Lymphadenopathy:     Head:     Right side of head: No submental or submandibular adenopathy.     Left side of head: Submandibular, preauricular and posterior auricular adenopathy present. No submental adenopathy.     Cervical: Cervical adenopathy present.     Left cervical: Posterior cervical adenopathy present.  Skin:    General: Skin is warm and dry.  Neurological:     General: No focal deficit present.     Mental Status: She is alert and oriented to person, place, and time. Mental status is at baseline.  Motor: No weakness.     Gait: Gait normal.  Psychiatric:        Mood and Affect: Mood normal.        Behavior: Behavior normal.        Thought Content: Thought content normal.        Judgment: Judgment normal.       Assessment & Plan:  Ear infection -     predniSONE ; Take 6 tablets (60 mg total) by mouth daily with breakfast for 1 day, THEN 5 tablets (50 mg total) daily with breakfast for 1 day, THEN 4 tablets (40 mg total) daily with breakfast for 1 day, THEN 3 tablets (30 mg total) daily with breakfast for 1 day, THEN 2 tablets (20 mg total) daily with breakfast for 1 day, THEN 1 tablet (10 mg total) daily with breakfast for 1 day.  Dispense: 21 tablet; Refill: 0 -     Amoxicillin -Pot Clavulanate; Take 1 tablet by mouth 2 (two) times daily for 7 days.   Dispense: 14 tablet; Refill: 0  Acute nonintractable headache, unspecified headache type -     predniSONE ; Take 6 tablets (60 mg total) by mouth daily with breakfast for 1 day, THEN 5 tablets (50 mg total) daily with breakfast for 1 day, THEN 4 tablets (40 mg total) daily with breakfast for 1 day, THEN 3 tablets (30 mg total) daily with breakfast for 1 day, THEN 2 tablets (20 mg total) daily with breakfast for 1 day, THEN 1 tablet (10 mg total) daily with breakfast for 1 day.  Dispense: 21 tablet; Refill: 0  -Prescribed Prednisone  10mg  tablet, 6 day taper for ear infection and headache described as pressure. Recommend to not take any additional NSAIDS, such as Ibuprofen, Aleve, Advil, or Naproxen while taking medication. May take Tylenol .  -Stop taking Ciprofloxacin-Dexamethasone  ear drops. Prescribed Augmentin  875-125mg  tablet, take 1 tablet every 12 hours for 7 days.  -If not improved in 1 week or symptoms become worse, please follow up with PCP or another provider in the office.   Timiya Howells, NP

## 2024-06-10 NOTE — Telephone Encounter (Signed)
 FYI Only or Action Required?: FYI only for provider.  Patient was last seen in primary care on 02/26/2023 by Theophilus Andrews, Tully GRADE, MD.  Called Nurse Triage reporting Otalgia.  Symptoms began several days ago.  Interventions attempted: OTC medications: tylenol  and Prescription medications: antibiotic ear dops.  Symptoms are: gradually worsening.  Triage Disposition: See Physician Within 24 Hours  Patient/caregiver understands and will follow disposition?: Yes    Copied from CRM #[id number]. Topic: Clinical - Red Word Triage >> Jun 10, 2024 10:13 AM [place] MATSU wrote: Red Word that prompted transfer to Nurse Triage: ear pain left ear. Reason for Disposition  [1] Taking antibiotic (ear drops or pills) > 72 hours (3 days) AND [2] ear PAIN NOT BETTER or recurs  Answer Assessment - Initial Assessment Questions 1. ANTIBIOTIC: What antibiotic are you using? How many times a day? Ear drops or pills?     Ear drops and on day 4 2. ONSET: When was the antibiotic started?     4 days ago  3. PAIN: How bad is the pain?  (Scale 0-10; none, mild, moderate or severe)     4/10 4. DISCHARGE: Is there any discharge? If Yes, ask: What color is it? (e.g., clear, white; yellow, green; bloody)     clear 5. FEVER: Do you have a fever? If Yes, ask: What is your temperature, how was it measured, and when did it start?     Feel like she might be but hasn't checked 6. OTHER SYMPTOMS: Do you have any other symptoms? (e.g., headache, redness of ear, sore throat, stiff neck)     Pain goes down in her jaw, and headache  Answer Assessment - Initial Assessment Questions 1. LOCATION: Which ear is involved?      Left ear 2. SYMPTOMS: What are the main symptoms? (e.g., pain, redness, itching, discharge)     Pressure and pain and feels like it fills up with water 3. MOVEMENT: Does the pain increase when the ear is moved up and down? Does pushing on the tab of tissue in the front of the  ear increase the pain?      yes 4. PAIN: How bad is the pain?  (Scale 0-10; none, mild, moderate or severe)     4/10 5. ONSET: When did the ear symptoms start?      sunday 6. DISCHARGE: Is there any discharge? What color is it?      *No Answer* 7. SWIMMING: Have you been swimming recently? If Yes, ask: How often do you swim? Is it in a pool, lake or ocean?      *No Answer* 8. COTTON EAR SWABS: Do you use cotton ear swabs (e.g., Q-tips)? How often? (e.g., never, daily, weekly)     *No Answer*  Protocols used: Ear - Swimmer's-A-AH, Ear - Otitis Externa Follow-up Call-A-AH

## 2024-06-14 ENCOUNTER — Ambulatory Visit: Payer: Self-pay

## 2024-06-14 DIAGNOSIS — H9209 Otalgia, unspecified ear: Secondary | ICD-10-CM

## 2024-06-14 DIAGNOSIS — H9210 Otorrhea, unspecified ear: Secondary | ICD-10-CM

## 2024-06-14 NOTE — Telephone Encounter (Signed)
 FYI Only or Action Required?: Action required by provider: clinical question for provider and update on patient condition.  Patient was last seen in primary care on 06/10/2024 by Billy Philippe SAUNDERS, NP.  Called Nurse Triage reporting Otitis Media.  Symptoms began a week ago.  Interventions attempted: Prescription medications: prednisone , augmentin .  Symptoms are: gradually improving.  Triage Disposition: See Physician Within 24 Hours  Patient/caregiver understands and will follow disposition?: No, wishes to speak with PCP   Message from [place] H sent at 06/14/2024 10:25 AM EDT  Summary: fluid in ear   Reason for Triage: feels like fluid is still in ear after last weeks visit and prescribed prednisone ,minimum pain      Reason for Disposition  [1] Taking antibiotic > 72 hours (3 days) and [2] pain persists or recurs  Answer Assessment - Initial Assessment Questions 1. ANTIBIOTIC: What antibiotic are you taking? How many times per day?     Augementin 2. ONSET: When was the antibiotic started?     06/10/24 3. LOCATION: Which ear is involved?      4. PAIN: How bad is the pain?   (Scale 0-10; none, mild, moderate or severe)     Minimal 5. FEVER: Do you have a fever? If Yes, ask: What is your temperature, how was it measured, and when did it start?     Denies 6. DISCHARGE: Is there any discharge? If Yes, ask: What color is it? (e.g., clear, white; yellow, green; bloody)     None 7. OTHER SYMPTOMS: Do you have any other symptoms? (e.g., headache, stiff neck, dizziness, vomiting, runny nose)     Feels fluid sloshing around   Additional info: Evaluated on 06/10/24 by Philippe, dx with ear infection, prescribed prednisone  and Augmentin . She has had some improvement to ear pressure but still feels fluid sloshing around. Offered acute visit today but patient declines office visit and would like [place] or pcp recommendation. Please follow up with patient.  Protocols  used: Ear - Otitis Media Follow-up Call-A-AH

## 2024-06-15 ENCOUNTER — Ambulatory Visit: Payer: Self-pay

## 2024-06-15 NOTE — Telephone Encounter (Signed)
 Attempted to call pt lvm

## 2024-06-15 NOTE — Telephone Encounter (Signed)
 FYI Only or Action Required?: FYI only for provider.  Patient was last seen in primary care on 06/10/2024 by Tami Philippe SAUNDERS, NP.  Called Nurse Triage reporting Follow-up.  Symptoms began several weeks ago.  Interventions attempted: Prescription medications: as prescribed.  Symptoms are: unchanged.  Triage Disposition: Duplicate Contact Calls  Patient/caregiver understands and will follow disposition?:   Copied from CRM [id number]. Topic: Clinical - Red Word Triage >> Jun 15, 2024  5:07 PM Tami Russell wrote: Red Word that prompted transfer to Nurse Triage: Patient is still experiencing ear pain. She was triaged on yesterday. Reason for Disposition  Caller has already spoken with another triager or doctor (or NP/PA, pharmacist) AND has further questions AND triager able to answer questions.  Answer Assessment - Initial Assessment Questions Patient called in to say that she has not heard back from provider on recommendation for ear symptoms. Scheduled follow up visit on 06/16/24 for continued symptoms on treatment  See nurse triage from 06/14/24 for back and forth communication with clinic staff  Protocols used: No Contact or Duplicate Contact Call-A-AH

## 2024-06-16 ENCOUNTER — Ambulatory Visit: Admitting: Family Medicine

## 2024-06-16 ENCOUNTER — Encounter: Payer: Self-pay | Admitting: *Deleted

## 2024-06-16 NOTE — Telephone Encounter (Signed)
 Sent referral lvm for pt to let pt know.

## 2024-06-16 NOTE — Telephone Encounter (Signed)
 Noted

## 2024-06-16 NOTE — Addendum Note (Signed)
 Addended by: ELNER NANNY B on: 06/16/2024 09:26 AM   Modules accepted: Orders

## 2024-06-18 ENCOUNTER — Other Ambulatory Visit: Payer: Self-pay | Admitting: Internal Medicine

## 2024-06-18 DIAGNOSIS — F3342 Major depressive disorder, recurrent, in full remission: Secondary | ICD-10-CM

## 2024-06-22 ENCOUNTER — Encounter: Payer: Self-pay | Admitting: Family Medicine

## 2024-06-22 ENCOUNTER — Ambulatory Visit: Admitting: Family Medicine

## 2024-06-22 ENCOUNTER — Ambulatory Visit: Payer: Self-pay

## 2024-06-22 ENCOUNTER — Telehealth: Payer: Self-pay

## 2024-06-22 VITALS — BP 118/80 | HR 76 | Temp 98.1°F | Ht 63.0 in | Wt 163.7 lb

## 2024-06-22 DIAGNOSIS — L270 Generalized skin eruption due to drugs and medicaments taken internally: Secondary | ICD-10-CM | POA: Diagnosis not present

## 2024-06-22 DIAGNOSIS — T50905A Adverse effect of unspecified drugs, medicaments and biological substances, initial encounter: Secondary | ICD-10-CM | POA: Diagnosis not present

## 2024-06-22 MED ORDER — CLOBETASOL PROPIONATE 0.05 % EX OINT: 1.0000 | TOPICAL_OINTMENT | Freq: Two times a day (BID) | CUTANEOUS | 0 refills | Status: AC

## 2024-06-22 MED ORDER — LEVOCETIRIZINE DIHYDROCHLORIDE 5 MG PO TABS: 5.0000 mg | ORAL_TABLET | Freq: Every evening | ORAL | 0 refills | Status: AC

## 2024-06-22 NOTE — Telephone Encounter (Signed)
 Noted

## 2024-06-22 NOTE — Progress Notes (Signed)
   Acute Office Visit  Subjective:     Patient ID: Tami Russell, female    DOB: 1966-03-16, 58 y.o.   MRN: [id number]  Chief Complaint  Patient presents with   Rash    Patient complains of an itchy, red rash, entire body, and inside of the mouth x3 days, denies use of new skin products, etc    Rash   Patient is in today for itchy rash all over body for the last 2-3 days, states that she has not changed any detergents, body washes, or other products. States that she was taking an antibiotic last week for an ear infection, had finished the abx last week. States she was out in the sun over the weekend  no other symptoms that she reports, no fever or chills, no chest pain, no further ear pain. States she was also on a steroid last week  Review of Systems  Skin:  Positive for rash.        Objective:    BP 118/80   Pulse 76   Temp 98.1 F (36.7 C) (Oral)   Ht 5' 3 (1.6 m)   Wt 163 lb 11.2 oz (74.3 kg)   LMP 11/15/2011   SpO2 97%   BMI 29.00 kg/m    Physical Exam Vitals reviewed.  Constitutional:      Appearance: Normal appearance.  Pulmonary:     Effort: Pulmonary effort is normal.     Breath sounds: Normal breath sounds. No wheezing or rhonchi.  Neurological:     Mental Status: She is alert.          No results found for any visits on 06/22/24.      Assessment & Plan:   Problem List Items Addressed This Visit   None Visit Diagnoses       Adverse effect of drug, initial encounter    -  Primary     Drug rash       Relevant Medications   levocetirizine (XYZAL ) 5 MG tablet   clobetasol  ointment (TEMOVATE ) 0.05 %       Meds ordered this encounter  Medications   levocetirizine (XYZAL ) 5 MG tablet    Sig: Take 1 tablet (5 mg total) by mouth every evening.    Dispense:  30 tablet    Refill:  0   clobetasol  ointment (TEMOVATE ) 0.05 %    Sig: Apply 1 Application topically 2 (two) times daily.    Dispense:  30 g    Refill:  0   Most likely  diagnosis is acute drug rash-- it may also be photosensitivity reaction since she went out in the sun after taking the abx also. She was on steroids during most of the time she was taking the abx so this might have suppressed the reaction. I have placed augmentin  in her allergy list and made her aware of this. Symptomatic tx with topical clobetasol  and oral xyzal  daily until sx resolve.   No follow-ups on file.  Heron CHRISTELLA Sharper, MD

## 2024-06-22 NOTE — Telephone Encounter (Signed)
 FYI Only or Action Required?: FYI only for provider.  Patient was last seen in primary care on 06/10/2024 by Tami Philippe SAUNDERS, NP.  Called Nurse Triage reporting Rash.  Symptoms began several days ago.  Interventions attempted: OTC medications: benadryl.  Symptoms are: gradually worsening.  Triage Disposition: See HCP Within 4 Hours (Or PCP Triage)  Patient/caregiver understands and will follow disposition?: Yes     Copied from CRM [id number]. Topic: Clinical - Red Word Triage >> Jun 22, 2024  3:11 PM Tami Russell wrote: Red Word that prompted transfer to Nurse Triage: Pt states that she has a rash from head to toe beginning on Sunday. Rash has moved to face and inside the mouth. Does not know the source of it if it was poison ivy or what it came from.  Finished antibiotic and was in the sun so thought it was maybe because of that, but it is getting worse.  No trouble breathing, issues with breathing, or fever. Reason for Disposition  Face or lip swelling  Answer Assessment - Initial Assessment Questions 1. APPEARANCE of RASH: What does the rash look like? (e.g., spots, blisters, raised areas, skin peeling, scaly)     It looks different in different places, whelps on arms, dots on legs and bright red, has one on roof of mouth, and lumpy on face 2. SIZE: How big are the spots? (e.g., tip of pen, eraser, coin; inches, centimeters)     All over body in multiple spots 3. LOCATION: Where is the rash located?        It looks different in different places, whelps on arms, dots on legs and bright red, has one on roof of mouth, and lumpy on face 4. COLOR: What color is the rash? (Note: It is difficult to assess rash color in people with darker-colored skin. When this situation occurs, simply ask the caller to describe what they see.)     Bright red all over arms and torso, and legs 5. ONSET: When did the rash begin?     Sunday 6. FEVER: Do you have a fever? If Yes, ask: What  is your temperature, how was it measured, and when did it start?     no 7. ITCHING: Does the rash itch? If Yes, ask: How bad is the itch? (Scale 1-10; or mild, moderate, severe)     Yesterday was like crazy but not much today  8. CAUSE: What do you think is causing the rash?     antibiotics 9. NEW MEDICINES: What new medicines are you taking? (e.g., name of antibiotic) When did you start taking this medication?Tami Russell     anitboitic 10. OTHER SYMPTOMS: Do you have any other symptoms? (e.g., sore throat, fever, joint pain)       Ear pain still there  Protocols used: Rash - Widespread On Drugs-A-AH

## 2024-06-22 NOTE — Telephone Encounter (Signed)
 Pt is on her way will be there in 5 minutes, booked for a 1530 appt.

## 2024-07-16 ENCOUNTER — Telehealth: Payer: Self-pay | Admitting: *Deleted

## 2024-07-16 DIAGNOSIS — F3342 Major depressive disorder, recurrent, in full remission: Secondary | ICD-10-CM

## 2024-07-16 NOTE — Telephone Encounter (Signed)
 Copied from CRM [id number]. Topic: Clinical - Medication Question >> Jul 16, 2024 10:35 AM Dedra B wrote: Reason for CRM: Pt normally gets her fluoxetine  through [facility]. She didn't place the refill order because she won't get it until Tuesday. And she leaves Tuesday morning to go out of town. She has enough meds to get her through next Wednesday but she doesn't return until next Sunday. Pt wants to know how she should proceed as she doesn't want to go 4 days without meds.

## 2024-07-19 MED ORDER — FLUOXETINE HCL 20 MG PO CAPS: 20.0000 mg | ORAL_CAPSULE | Freq: Every day | ORAL | 0 refills | Status: DC

## 2024-07-19 NOTE — Telephone Encounter (Signed)
 Copied from CRM [id number]. Topic: Clinical - Medication Question >> Jul 19, 2024 10:24 AM Zebedee SAUNDERS wrote: Pt calling regarding enough medication until next refill. Please call pt at [phone]  [id number] [place], [place] - [address] AT [facility] OF [facility] [address]  [address] [place] [address]  Phone: [phone] Fax: [phone]

## 2024-07-19 NOTE — Telephone Encounter (Signed)
 Pt called in again to check if her med was refilled. I informed her that due to her request for refills being sent Friday, the turn around time may take up to 3 business days. She stated that it is urgent because she is leaving out of town tomorrow morning. I explained to her that several messages has already been sent to the clinic informing them of that information and that I could addend to the message. She is worried of getting sick without her medication so I suggested that we could send it to a pharmacy in the area she will be traveling too to avoid her falling behind on the medication. She preferred for a message to be sent back for a nurse to call her with an update on her request. I reassured her that I would but could not guarantee if rx could be sent by today.   She is expecting a callback before EOD. Please advise at [phone]

## 2024-07-19 NOTE — Telephone Encounter (Signed)
 Refill sent.

## 2024-07-21 ENCOUNTER — Other Ambulatory Visit: Payer: Self-pay | Admitting: Internal Medicine

## 2024-07-21 DIAGNOSIS — E785 Hyperlipidemia, unspecified: Secondary | ICD-10-CM

## 2024-07-22 ENCOUNTER — Other Ambulatory Visit: Payer: Self-pay | Admitting: Internal Medicine

## 2024-07-22 DIAGNOSIS — F3342 Major depressive disorder, recurrent, in full remission: Secondary | ICD-10-CM

## 2024-08-17 ENCOUNTER — Other Ambulatory Visit: Payer: Self-pay | Admitting: Internal Medicine

## 2024-08-17 DIAGNOSIS — F3342 Major depressive disorder, recurrent, in full remission: Secondary | ICD-10-CM

## 2024-10-23 ENCOUNTER — Other Ambulatory Visit: Payer: Self-pay | Admitting: Internal Medicine

## 2024-10-23 DIAGNOSIS — F3342 Major depressive disorder, recurrent, in full remission: Secondary | ICD-10-CM

## 2024-11-07 ENCOUNTER — Other Ambulatory Visit: Payer: Self-pay | Admitting: Internal Medicine

## 2024-11-07 DIAGNOSIS — E785 Hyperlipidemia, unspecified: Secondary | ICD-10-CM

## 2024-11-24 ENCOUNTER — Emergency Department (HOSPITAL_COMMUNITY)

## 2024-11-24 ENCOUNTER — Emergency Department (HOSPITAL_COMMUNITY)
Admission: EM | Admit: 2024-11-24 | Discharge: 2024-11-24 | Disposition: A | Discharge status: Home / Self Care | Attending: Emergency Medicine | Admitting: Emergency Medicine

## 2024-11-24 ENCOUNTER — Other Ambulatory Visit: Payer: Self-pay

## 2024-11-24 DIAGNOSIS — R0789 Other chest pain: Secondary | ICD-10-CM | POA: Insufficient documentation

## 2024-11-24 LAB — BASIC METABOLIC PANEL WITH GFR
Anion gap: 11 (ref 5–15)
BUN: 14 mg/dL (ref 6–20)
CO2: 26 mmol/L (ref 22–32)
Calcium: 9.4 mg/dL (ref 8.9–10.3)
Chloride: 102 mmol/L (ref 98–111)
Creatinine, Ser: 0.73 mg/dL (ref 0.44–1.00)
GFR, Estimated: >60 mL/min
Glucose, Bld: 103 mg/dL — ABNORMAL HIGH (ref 70–99)
Potassium: 3.8 mmol/L (ref 3.5–5.1)
Sodium: 138 mmol/L (ref 135–145)

## 2024-11-24 LAB — CBC
HCT: 42.6 % (ref 36.0–46.0)
Hemoglobin: 13.7 g/dL (ref 12.0–15.0)
MCH: 30.8 pg (ref 26.0–34.0)
MCHC: 32.2 g/dL (ref 30.0–36.0)
MCV: 95.7 fL (ref 80.0–100.0)
Platelets: 248 K/uL (ref 150–400)
RBC: 4.45 MIL/uL (ref 3.87–5.11)
RDW: 12.1 % (ref 11.5–15.5)
WBC: 5.1 K/uL (ref 4.0–10.5)
nRBC: 0 % (ref 0.0–0.2)

## 2024-11-24 LAB — TROPONIN T, HIGH SENSITIVITY: Troponin T High Sensitivity: <6 ng/L (ref 0–19)

## 2024-11-24 NOTE — ED Provider Notes (Signed)
 " [place] EMERGENCY DEPARTMENT AT [facility] Provider Note   CSN: [id number] Arrival date & time: 11/24/24  9083     Patient presents with: Chest Pain   Tami Russell is a 59 y.o. female. With pertinent medical history of pituitary microadenoma, hyperlipidemia, depression, vitamin D  and B12 deficiency.   Patient is here for evaluation of left-sided chest pain that began last night.  She reports pain is still present at this time though it has eased off.  She denies radiation of this pain or associated shortness of breath, nausea, vomiting, weakness, visual changes, or syncope.  She denies recent physical exertion or heavy lifting.  She says pains like this past however they were brief and did not persist.  Denies cardiac history.  No family history of cardiac issues.  Both parents did suffer strokes at >37 years old.  Denies history of hypertension.  Does have a history of hyperlipidemia taking atorvastatin .  Denies recent leg swelling, unilateral redness or pain of lower extremity, or recent illness.  Denies palpitations, dizziness or syncope. Denies skin changes. Husband questions if symptoms could be reflux from eating banana peppers.  The history is provided by the patient and the spouse.  Chest Pain Pain location:  L chest      Prior to Admission medications  Medication Sig Start Date End Date Taking? Authorizing Provider  atorvastatin  (LIPITOR) 20 MG tablet Take 1 tablet by mouth at bedtime. 11/08/24   Theophilus Andrews, Tully GRADE, MD  calcium  citrate-vitamin D  200-200 MG-UNIT TABS Take 2 tablets by mouth daily.      [provider]  clobetasol  ointment (TEMOVATE ) 0.05 % Apply 1 Application topically 2 (two) times daily. 06/22/24   Ozell Heron HERO, MD  DOTTI 0.05 MG/24HR patch Place onto the skin. 08/24/20   [provider]  FLUoxetine  (PROZAC ) 20 MG capsule Take 1 capsule by mouth daily. Please schedule an appointment for a physical with your  provider for additional refills. 10/25/24   Theophilus Andrews, Tully GRADE, MD  ibuprofen (ADVIL,MOTRIN) 200 MG tablet Take 200 mg by mouth as needed. Pain       [provider]  levocetirizine (XYZAL ) 5 MG tablet Take 1 tablet (5 mg total) by mouth every evening. 06/22/24   Ozell Heron HERO, MD  progesterone [facility]) 100 MG capsule  08/24/20   [provider]    Allergies: Augmentin  [amoxicillin -pot clavulanate] and Sulfa antibiotics    Review of Systems  Cardiovascular:  Positive for chest pain.    Updated Vital Signs BP (!) 148/85   Pulse 62   Temp 98.1 F (36.7 C) (Oral)   Resp 18   Ht 5' 2 (1.575 m)   Wt 70.3 kg   LMP 11/15/2011   SpO2 98%   BMI 28.35 kg/m   Physical Exam Vitals and nursing note reviewed.  Constitutional:      General: She is not in acute distress.    Appearance: Normal appearance. She is well-developed. She is not ill-appearing, toxic-appearing or diaphoretic.  HENT:     Head: Normocephalic and atraumatic.     Nose: Nose normal.     Mouth/Throat:     Mouth: Mucous membranes are moist.  Eyes:     General: No scleral icterus.    Extraocular Movements: Extraocular movements intact.     Conjunctiva/sclera: Conjunctivae normal.     Pupils: Pupils are equal, round, and reactive to light.  Cardiovascular:     Rate and Rhythm: Normal rate and  regular rhythm.     Pulses: Normal pulses.     Heart sounds: Normal heart sounds.  Pulmonary:     Effort: Pulmonary effort is normal. No tachypnea, accessory muscle usage or respiratory distress.     Breath sounds: Normal breath sounds. No stridor. No decreased breath sounds, wheezing, rhonchi or rales.  Chest:     Chest wall: No deformity or tenderness.  Abdominal:     General: Abdomen is flat. Bowel sounds are normal. There is no distension.     Palpations: Abdomen is soft.     Tenderness: There is no abdominal tenderness. There is no guarding.  Musculoskeletal:        General: Normal  range of motion.     Cervical back: Normal range of motion.     Right lower leg: No tenderness. No edema.     Left lower leg: No tenderness. No edema.  Skin:    General: Skin is warm and dry.     Capillary Refill: Capillary refill takes less than 2 seconds.     Coloration: Skin is not jaundiced or pale.     Findings: No erythema or rash.  Neurological:     Mental Status: She is alert and oriented to person, place, and time.     (all labs ordered are listed, but only abnormal results are displayed) Labs Reviewed  BASIC METABOLIC PANEL WITH GFR - Abnormal; Notable for the following components:      Result Value   Glucose, Bld 103 (*)    All other components within normal limits  CBC  TROPONIN T, HIGH SENSITIVITY    EKG: None  Radiology: DG Chest 2 View Result Date: 11/24/2024 CLINICAL DATA:  Chest pain. EXAM: CHEST - 2 VIEW COMPARISON:  02/02/2011. FINDINGS: Trachea is midline. Heart size normal. Lungs are clear. No pleural fluid. IMPRESSION: Negative. Electronically Signed   By: Newell Eke M.D.   On: 11/24/2024 10:51     Procedures   Medications Ordered in the ED - No data to display   Patient presents to the ED for concern of chest pain, this involves an extensive number of treatment options, and is a complaint that carries with it a high risk of complications and morbidity.  The differential diagnosis includes ACS, biliary disease, GERD, peptic ulcer disease, anxiety, pneumonia, pneumothorax, aortic dissection, musculoskeletal.    Co morbidities that complicate the patient evaluation  Hyperlipidemia   Lab Tests:  I Ordered, and personally interpreted labs.  The pertinent results include: Normal troponin.  Normal CBC and BMP.   Imaging Studies ordered:  I ordered imaging studies including chest x-ray  I independently visualized and interpreted imaging which showed no acute cardiopulmonary findings. I agree with the radiologist interpretation   Cardiac  Monitoring:  The patient was maintained on a cardiac monitor.  I personally viewed and interpreted the cardiac monitored which showed an underlying rhythm of: Sinus rhythm with no evidence of STEMI   Problem List / ED Course:     EKG, normal initial troponin, and low risk HEART score lower suspicion for ACS. Based on vitals and history I have a low suspicion for PE. Vitals and imaging are not consistent with pneumonia, pneumothorax, or aortic dissection. Labs are not impressive for biliary obstruction.  Atypical chest pain. Vitals, physical exam, lab work, EKG, and imaging is reassuring for non-acute process. Low risk HEART score. Likely GERD or musculoskeletal in origin though cannot rule out peptic ulcer disease. I do not feel additional emergent  work up warranted at this time. Encouraged follow up with primary care for ongoing evaluation. Return precautions discussed and patient verbalized understanding.   Reevaluation:  After the interventions noted above, I reevaluated the patient and found that they have :improved   Dispostion:  After consideration of the diagnostic results and the patients response to treatment, I feel that the patent would benefit from supportive care in the home setting, continued monitoring of symptoms, and close follow-up with primary care.  Return precautions given.              HEART Score: 2                Geneva (Revised) Score: 0, Geneva Score Interpretation: Low Risk Group: 7-9% incidence of pulmonary embolism from several studies PERC Score: 1, PERC Score Interpretation: If any criteria are positive, the PERC rule cannot be used to rule out PE in this patient Medical Decision Making Amount and/or Complexity of Data Reviewed Labs: ordered. Radiology: ordered.   This note was produced using [employer]. While the provider has reviewed and verified all clinical information, transcription errors may remain.    Final diagnoses:   Atypical chest pain    ED Discharge Orders     None          Tami Russell 11/24/24 1718  "

## 2024-11-24 NOTE — Discharge Instructions (Addendum)
 It was a pleasure meeting with you today. As we discussed your EKG, chest x-ray, and blood work were normal with no acute findings. Continue to monitor symptoms and follow-up with primary care for ongoing evaluation. Return to ED if symptoms worsen or new concerning symptoms develop.

## 2024-11-24 NOTE — ED Triage Notes (Signed)
 Patient reports  left sided chest pain that started last night, denies n/v/SOB. Patient is alert and oriented x 4. Airway patent, respirations even and unlabored. Skin normal, warm and dry.

## 2024-11-28 ENCOUNTER — Other Ambulatory Visit: Payer: Self-pay | Admitting: Internal Medicine

## 2024-11-28 DIAGNOSIS — E785 Hyperlipidemia, unspecified: Secondary | ICD-10-CM

## 2024-12-06 ENCOUNTER — Other Ambulatory Visit: Payer: Self-pay | Admitting: Internal Medicine

## 2024-12-06 DIAGNOSIS — F3342 Major depressive disorder, recurrent, in full remission: Secondary | ICD-10-CM
